# Patient Record
Sex: Male | Born: 1945 | Race: White | Hispanic: No | Marital: Married | Smoking: Never smoker
Health system: Southern US, Community
[De-identification: ages and names within clinical notes are randomized; demographics above are authoritative.]

## PROBLEM LIST (undated history)

## (undated) DIAGNOSIS — E785 Hyperlipidemia, unspecified: Secondary | ICD-10-CM

## (undated) DIAGNOSIS — G709 Myoneural disorder, unspecified: Secondary | ICD-10-CM

## (undated) DIAGNOSIS — F39 Unspecified mood [affective] disorder: Secondary | ICD-10-CM

## (undated) DIAGNOSIS — I639 Cerebral infarction, unspecified: Secondary | ICD-10-CM

## (undated) DIAGNOSIS — F329 Major depressive disorder, single episode, unspecified: Secondary | ICD-10-CM

## (undated) DIAGNOSIS — F32A Depression, unspecified: Secondary | ICD-10-CM

## (undated) DIAGNOSIS — M1990 Unspecified osteoarthritis, unspecified site: Secondary | ICD-10-CM

## (undated) DIAGNOSIS — I10 Essential (primary) hypertension: Secondary | ICD-10-CM

## (undated) DIAGNOSIS — C801 Malignant (primary) neoplasm, unspecified: Secondary | ICD-10-CM

## (undated) DIAGNOSIS — G459 Transient cerebral ischemic attack, unspecified: Secondary | ICD-10-CM

## (undated) DIAGNOSIS — F419 Anxiety disorder, unspecified: Secondary | ICD-10-CM

## (undated) DIAGNOSIS — H9190 Unspecified hearing loss, unspecified ear: Secondary | ICD-10-CM

## (undated) DIAGNOSIS — M47816 Spondylosis without myelopathy or radiculopathy, lumbar region: Secondary | ICD-10-CM

## (undated) HISTORY — DX: Depression, unspecified: F32.A

## (undated) HISTORY — DX: Major depressive disorder, single episode, unspecified: F32.9

## (undated) HISTORY — DX: Unspecified osteoarthritis, unspecified site: M19.90

## (undated) HISTORY — DX: Myoneural disorder, unspecified: G70.9

## (undated) HISTORY — DX: Unspecified mood (affective) disorder: F39

## (undated) HISTORY — DX: Hyperlipidemia, unspecified: E78.5

## (undated) HISTORY — PX: TONSILLECTOMY: SUR1361

## (undated) HISTORY — DX: Essential (primary) hypertension: I10

## (undated) HISTORY — DX: Malignant (primary) neoplasm, unspecified: C80.1

## (undated) HISTORY — DX: Spondylosis without myelopathy or radiculopathy, lumbar region: M47.816

## (undated) HISTORY — PX: SHOULDER SURGERY: SHX246

## (undated) HISTORY — DX: Cerebral infarction, unspecified: I63.9

## (undated) HISTORY — PX: EYE SURGERY: SHX253

## (undated) HISTORY — DX: Unspecified hearing loss, unspecified ear: H91.90

## (undated) HISTORY — DX: Anxiety disorder, unspecified: F41.9

---

## 2005-04-28 ENCOUNTER — Encounter: Payer: Self-pay | Admitting: Internal Medicine

## 2006-02-12 ENCOUNTER — Encounter: Admission: RE | Admit: 2006-02-12 | Discharge: 2006-02-22 | Payer: Self-pay | Admitting: Neurology

## 2006-02-12 ENCOUNTER — Ambulatory Visit: Payer: Self-pay | Admitting: Psychology

## 2007-03-01 ENCOUNTER — Ambulatory Visit: Payer: Self-pay | Admitting: *Deleted

## 2007-07-10 ENCOUNTER — Ambulatory Visit: Payer: Self-pay | Admitting: *Deleted

## 2007-07-24 ENCOUNTER — Ambulatory Visit: Payer: Self-pay | Admitting: *Deleted

## 2007-08-07 ENCOUNTER — Ambulatory Visit: Payer: Self-pay | Admitting: *Deleted

## 2008-10-06 ENCOUNTER — Ambulatory Visit: Payer: Self-pay | Admitting: Internal Medicine

## 2008-10-29 ENCOUNTER — Encounter: Payer: Self-pay | Admitting: Internal Medicine

## 2008-10-29 ENCOUNTER — Ambulatory Visit: Payer: Self-pay | Admitting: Internal Medicine

## 2008-10-31 ENCOUNTER — Encounter: Payer: Self-pay | Admitting: Internal Medicine

## 2010-11-07 ENCOUNTER — Encounter (HOSPITAL_COMMUNITY): Payer: Self-pay | Admitting: Pharmacy Technician

## 2010-11-08 ENCOUNTER — Inpatient Hospital Stay (HOSPITAL_COMMUNITY): Admission: RE | Admit: 2010-11-08 | Payer: Self-pay | Source: Ambulatory Visit

## 2010-11-14 ENCOUNTER — Inpatient Hospital Stay (HOSPITAL_COMMUNITY): Admission: RE | Admit: 2010-11-14 | Payer: Medicare Other | Source: Ambulatory Visit | Admitting: Orthopedic Surgery

## 2010-11-14 ENCOUNTER — Encounter (HOSPITAL_COMMUNITY): Admission: RE | Payer: Self-pay | Source: Ambulatory Visit

## 2010-11-14 SURGERY — ARTHROPLASTY, HIP, TOTAL,POSTERIOR APPROACH
Anesthesia: General | Laterality: Right

## 2010-12-23 ENCOUNTER — Ambulatory Visit (INDEPENDENT_AMBULATORY_CARE_PROVIDER_SITE_OTHER): Payer: Medicare Other

## 2010-12-23 ENCOUNTER — Encounter: Payer: Self-pay | Admitting: Internal Medicine

## 2010-12-23 DIAGNOSIS — H9190 Unspecified hearing loss, unspecified ear: Secondary | ICD-10-CM | POA: Insufficient documentation

## 2010-12-23 DIAGNOSIS — Z0000 Encounter for general adult medical examination without abnormal findings: Secondary | ICD-10-CM

## 2010-12-23 DIAGNOSIS — F341 Dysthymic disorder: Secondary | ICD-10-CM

## 2010-12-23 DIAGNOSIS — E7800 Pure hypercholesterolemia, unspecified: Secondary | ICD-10-CM

## 2010-12-23 DIAGNOSIS — F39 Unspecified mood [affective] disorder: Secondary | ICD-10-CM | POA: Insufficient documentation

## 2010-12-23 DIAGNOSIS — E785 Hyperlipidemia, unspecified: Secondary | ICD-10-CM | POA: Insufficient documentation

## 2010-12-23 DIAGNOSIS — I10 Essential (primary) hypertension: Secondary | ICD-10-CM

## 2010-12-23 DIAGNOSIS — Z23 Encounter for immunization: Secondary | ICD-10-CM

## 2011-01-25 ENCOUNTER — Encounter: Payer: Self-pay | Admitting: Internal Medicine

## 2011-03-06 ENCOUNTER — Telehealth: Payer: Self-pay

## 2011-03-06 NOTE — Telephone Encounter (Signed)
PT called his buprion was changed from 3 per day to 2 per day.  He would like to return to 3 per day.  Pt of dr Merla Riches.

## 2011-03-06 NOTE — Telephone Encounter (Signed)
[facility] calling for dr Merla Riches with questions about pts Bupropion rx.   Best: [phone]  bf

## 2011-03-08 NOTE — Telephone Encounter (Signed)
Need chart I guess-don't remember seeing him or changing meds recently

## 2011-03-09 MED ORDER — BUPROPION HCL ER (SR) 150 MG PO TB12: 150.0000 mg | ORAL_TABLET | Freq: Three times a day (TID) | ORAL | Status: DC

## 2011-03-09 NOTE — Telephone Encounter (Signed)
Routing to [employer] w/chart since Dr Merla Riches will not be in today.

## 2011-03-09 NOTE — Telephone Encounter (Signed)
Ok'd Wellbutrin Sr 150mg  tid with pharmacy.

## 2011-03-20 ENCOUNTER — Telehealth: Payer: Self-pay

## 2011-03-20 NOTE — Telephone Encounter (Signed)
Patient dropped off form for Dr. Merla Riches to sign for scuba class. I assume patient wants form mailed to him after filling it out because he left an envelope with it. *Chart at [employer].*

## 2011-03-21 NOTE — Telephone Encounter (Signed)
Spoke with pt to let him know that Dr Merla Riches will be out of office until next week. Pt stated that it can wait if necessary, but if Dr Cleta Alberts could do it this week it would be best. Dr Cleta Alberts, pts chart is in your box.

## 2011-03-22 NOTE — Telephone Encounter (Signed)
Called pt back and let him know that Dr Merla Riches will need to fill out form next week and that we will let him know when ready. Pt said that was fine.

## 2011-04-01 ENCOUNTER — Ambulatory Visit (INDEPENDENT_AMBULATORY_CARE_PROVIDER_SITE_OTHER): Payer: Medicare Other

## 2011-05-22 ENCOUNTER — Ambulatory Visit (INDEPENDENT_AMBULATORY_CARE_PROVIDER_SITE_OTHER): Payer: Medicare Other | Admitting: Internal Medicine

## 2011-05-22 ENCOUNTER — Ambulatory Visit: Payer: Medicare Other

## 2011-05-22 VITALS — BP 124/71 | HR 80 | Temp 98.4°F | Resp 16 | Ht 68.5 in | Wt 186.0 lb

## 2011-05-22 DIAGNOSIS — S60229A Contusion of unspecified hand, initial encounter: Secondary | ICD-10-CM

## 2011-05-22 DIAGNOSIS — S92403A Displaced unspecified fracture of unspecified great toe, initial encounter for closed fracture: Secondary | ICD-10-CM

## 2011-05-22 DIAGNOSIS — M79675 Pain in left toe(s): Secondary | ICD-10-CM

## 2011-05-22 DIAGNOSIS — M25549 Pain in joints of unspecified hand: Secondary | ICD-10-CM

## 2011-05-22 DIAGNOSIS — M79643 Pain in unspecified hand: Secondary | ICD-10-CM

## 2011-05-22 DIAGNOSIS — M79609 Pain in unspecified limb: Secondary | ICD-10-CM

## 2011-05-22 MED ORDER — TRAMADOL HCL 50 MG PO TABS: 50.0000 mg | ORAL_TABLET | Freq: Three times a day (TID) | ORAL | Status: AC | PRN

## 2011-05-22 MED ORDER — CEPHALEXIN 500 MG PO CAPS: 500.0000 mg | ORAL_CAPSULE | Freq: Three times a day (TID) | ORAL | Status: AC

## 2011-05-22 NOTE — Patient Instructions (Signed)
Wear the postop shoe as directed. Ice elevation to injured areas. Daily dressing change. Ultram as directed. Return to office for follow up in one week

## 2011-05-22 NOTE — Progress Notes (Signed)
   Patient ID: Windsor Goeken MRN: [id number], DOB: Jan 31, 1945, 66 y.o. Date of Encounter: 05/22/2011, 9:26 AM   PROCEDURE NOTE: Verbal consent obtained. Sterile technique employed. Numbing: Anesthesia obtained with 1:1 ratio 2% plain lidocaine with 0.5% Marcaine  Betadine prep per usual protocol.  Burr hole times three to relieve pressure underneath the nail.  Dark blood expressed through burr holes.   Wound care instructions including precautions covered with patient. Handout given.  Patient tolerated procedure well.  Signed,  Eula Listen, PA-C 05/22/2011 9:26 AM

## 2011-05-22 NOTE — Progress Notes (Signed)
  Subjective:    Patient ID: Reginald Edwards, male    DOB: [birth year], 66 y.o.   MRN: [id number]  HPI Pain to the right hand and the left great toe after he fell 2 days ago. Pain and swelling to the great toe.pain 10/10 increased with walking. Hand pain 6/10 increased with motion, moderate in severity. No loc no head or other injuries.No radiation of pain. Increases with motion.   Review of Systems  Constitutional: Negative.   HENT: Negative.   Eyes: Negative.   Respiratory: Negative.   Cardiovascular: Negative.   Gastrointestinal: Negative.   Genitourinary: Negative.   Musculoskeletal: Positive for joint swelling.       Great toe swelling of the left toe.pain in palpation. Swelling and pain of right hand digits 3 4 and 5  Skin: Negative.   Neurological: Negative.   Hematological: Negative.   Psychiatric/Behavioral: Negative.        Objective:   Physical Exam  Nursing note and vitals reviewed. Constitutional: He is oriented to person, place, and time. He appears well-developed and well-nourished.  HENT:  Head: Normocephalic and atraumatic.  Nose: Nose normal.  Mouth/Throat: Oropharynx is clear and moist.  Eyes: Conjunctivae and EOM are normal. Pupils are equal, round, and reactive to light.  Neck: Normal range of motion. Neck supple.  Cardiovascular: Normal rate and regular rhythm.   Pulmonary/Chest: Effort normal.  Abdominal: Soft.  Musculoskeletal:       Great toe has a subungual hematoma and ecchymosis to the entire great left toe. Also right hand with ecchymosis to digits 345 and the palm of the right hand.  Neurological: He is alert and oriented to person, place, and time.  Skin: Skin is warm and dry.  Psychiatric: He has a normal mood and affect. His behavior is normal. Judgment and thought content normal.     [facility] reading (PRIMARY) by  Dr. Mindi Junker  small avulsion fx at the base of the great left toe ..hand xray r hand is negative for fracture or dislocation.      Assessment & Plan:  subungual hematoma of the great left toe with avulsion fracture. Will release the hematoma and place patient in a postop shoe with analgesics. Hand injury to your rubber ball for pt and analgesics.

## 2011-08-05 ENCOUNTER — Ambulatory Visit (INDEPENDENT_AMBULATORY_CARE_PROVIDER_SITE_OTHER): Payer: Medicare Other | Admitting: Internal Medicine

## 2011-08-05 VITALS — BP 135/77 | HR 89 | Temp 99.0°F | Resp 16 | Ht 68.5 in | Wt 187.0 lb

## 2011-08-05 DIAGNOSIS — G252 Other specified forms of tremor: Secondary | ICD-10-CM

## 2011-08-05 DIAGNOSIS — G250 Essential tremor: Secondary | ICD-10-CM

## 2011-08-05 MED ORDER — PROPRANOLOL HCL ER 80 MG PO CP24: 80.0000 mg | ORAL_CAPSULE | Freq: Every day | ORAL | Status: DC

## 2011-08-05 NOTE — Progress Notes (Signed)
  Subjective:    Patient ID: Reginald Edwards, male    DOB: [birth year], 66 y.o.   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 propranolol ER (INDERAL LA) 80 MG 24 hr capsule    Sig: Take 1 capsule (80 mg total) by mouth daily.    Dispense:  30 capsule    Refill:  0

## 2011-08-31 ENCOUNTER — Ambulatory Visit (INDEPENDENT_AMBULATORY_CARE_PROVIDER_SITE_OTHER): Payer: Medicare Other | Admitting: Emergency Medicine

## 2011-08-31 VITALS — BP 160/80 | HR 102 | Temp 98.4°F | Resp 18 | Ht 68.0 in | Wt 186.0 lb

## 2011-08-31 DIAGNOSIS — R9431 Abnormal electrocardiogram [ECG] [EKG]: Secondary | ICD-10-CM

## 2011-08-31 DIAGNOSIS — F419 Anxiety disorder, unspecified: Secondary | ICD-10-CM

## 2011-08-31 DIAGNOSIS — F411 Generalized anxiety disorder: Secondary | ICD-10-CM

## 2011-08-31 LAB — COMPREHENSIVE METABOLIC PANEL
ALT: 41 U/L (ref 0–53)
BUN: 17 mg/dL (ref 6–23)
CO2: 31 mEq/L (ref 19–32)
Calcium: 10.1 mg/dL (ref 8.4–10.5)
Chloride: 103 mEq/L (ref 96–112)
Creat: 1.19 mg/dL (ref 0.50–1.35)
Glucose, Bld: 137 mg/dL — ABNORMAL HIGH (ref 70–99)
Total Bilirubin: 0.4 mg/dL (ref 0.3–1.2)

## 2011-08-31 LAB — POCT CBC
HCT, POC: 48.5 % (ref 43.5–53.7)
Hemoglobin: 15.7 g/dL (ref 14.1–18.1)
Lymph, poc: 2.2 (ref 0.6–3.4)
MCH, POC: 29.8 pg (ref 27–31.2)
MCHC: 32.4 g/dL (ref 31.8–35.4)
MCV: 92.1 fL (ref 80–97)
POC Granulocyte: 7.9 — AB (ref 2–6.9)
POC LYMPH PERCENT: 20 %L (ref 10–50)
RDW, POC: 13.8 %
WBC: 11 10*3/uL — AB (ref 4.6–10.2)

## 2011-08-31 MED ORDER — ALPRAZOLAM 0.25 MG PO TABS
0.2500 mg | ORAL_TABLET | Freq: Once | ORAL | Status: AC
  Administered 2011-08-31: 0.25 mg via ORAL

## 2011-08-31 MED ORDER — ALPRAZOLAM 0.25 MG PO TABS: 0.2500 mg | ORAL_TABLET | Freq: Two times a day (BID) | ORAL | Status: AC | PRN

## 2011-08-31 MED ORDER — ALPRAZOLAM 0.25 MG PO TABS: 0.5000 mg | ORAL_TABLET | Freq: Once | ORAL | Status: DC

## 2011-08-31 NOTE — Progress Notes (Signed)
  Subjective:    Patient ID: Reginald Edwards, male    DOB: [birth year], 66 y.o.   MRN: [id number]  HPI presents today with anxiety x 3 weeks. Its progressively gotten worse.Today is the worst it has been. He feels like he could vomit because its so bad. He has been chewing on his lip, nausea, vomiting, and tremors. Can't get focused and started on task. Sometimes he gets a tightness feel and feels like he cant get things done. He is retired and states no problems home, family or kids. Not sure what has caused this. Feels like he is psychologically frozen. He has one caffeine drink a day. He is on Adderall 20mg .     Review of Systems     Objective:   Physical Exam HEENT exam is unremarkable. His thyroid is not enlarged. His chest is clear his cardiac exam reveals tachycardia. Abdomen is soft and nontender. Neurological exam reveals the patient to the alert and cooperative. His hands are tremulous. Deep tendon reflexes are symmetrical.  Results for orders placed in visit on 08/31/11  POCT CBC      Component Value Range   WBC 11.0 (*) 4.6 - 10.2 K/uL   Lymph, poc 2.2  0.6 - 3.4   POC LYMPH PERCENT 20.0  10 - 50 %L   MID (cbc) 0.9  0 - 0.9   POC MID % 8.6  0 - 12 %M   POC Granulocyte 7.9 (*) 2 - 6.9   Granulocyte percent 71.4  37 - 80 %G   RBC 5.27  4.69 - 6.13 M/uL   Hemoglobin 15.7  14.1 - 18.1 g/dL   HCT, POC 40.9  81.1 - 53.7 %   MCV 92.1  80 - 97 fL   MCH, POC 29.8  27 - 31.2 pg   MCHC 32.4  31.8 - 35.4 g/dL   RDW, POC 91.4     Platelet Count, POC 295  142 - 424 K/uL   MPV 8.2  0 - 99.8 fL        Assessment & Plan:  Go ahead and give Xanax 0.25 year routine labs including thyroid testing will be performed. We'll do a baseline EKG because of his tachycardia. He was instructed to stop his Adderall as this may be contributing to his anxiousness. I think a revisit with Dr. Evelene Croon would be a good idea to see if she may need to readjust his medications.. Will cover with Xanax 0.25 3 times  a day until that appointment with Dr. Evelene Croon.

## 2011-09-01 LAB — T4, FREE: Free T4: 1.26 ng/dL (ref 0.80–1.80)

## 2011-09-02 ENCOUNTER — Telehealth: Payer: Self-pay | Admitting: Family Medicine

## 2011-09-02 NOTE — Telephone Encounter (Signed)
Patient much better he has an appt in Oct to see Dr Ina Kick him lab results also

## 2011-09-21 ENCOUNTER — Ambulatory Visit: Payer: Medicare Other | Admitting: Cardiology

## 2011-09-28 ENCOUNTER — Encounter: Payer: Self-pay | Admitting: Emergency Medicine

## 2011-09-29 ENCOUNTER — Encounter: Payer: Self-pay | Admitting: *Deleted

## 2011-10-02 ENCOUNTER — Encounter: Payer: Self-pay | Admitting: Cardiology

## 2011-10-02 ENCOUNTER — Ambulatory Visit (INDEPENDENT_AMBULATORY_CARE_PROVIDER_SITE_OTHER): Payer: Medicare Other | Admitting: Cardiology

## 2011-10-02 VITALS — BP 124/88 | HR 61 | Ht 68.0 in | Wt 195.0 lb

## 2011-10-02 DIAGNOSIS — R0789 Other chest pain: Secondary | ICD-10-CM | POA: Insufficient documentation

## 2011-10-02 DIAGNOSIS — R0602 Shortness of breath: Secondary | ICD-10-CM

## 2011-10-02 DIAGNOSIS — G35 Multiple sclerosis: Secondary | ICD-10-CM | POA: Insufficient documentation

## 2011-10-02 DIAGNOSIS — R0609 Other forms of dyspnea: Secondary | ICD-10-CM | POA: Insufficient documentation

## 2011-10-02 DIAGNOSIS — R9431 Abnormal electrocardiogram [ECG] [EKG]: Secondary | ICD-10-CM

## 2011-10-02 DIAGNOSIS — R0989 Other specified symptoms and signs involving the circulatory and respiratory systems: Secondary | ICD-10-CM

## 2011-10-02 NOTE — Patient Instructions (Addendum)
Decrease aspirin to 81mg  daily.  Your physician recommends that you have lab work today--BNP.  Your physician has requested that you have an echocardiogram. Echocardiography is a painless test that uses sound waves to create images of your heart. It provides your doctor with information about the size and shape of your heart and how well your heart's chambers and valves are working. This procedure takes approximately one hour. There are no restrictions for this procedure.  Your physician has requested that you have a lexiscan myoview. For further information please visit [email or web address] Please follow instruction sheet, as given.  Your physician recommends that you schedule a follow-up appointment in: 2 weeks with Dr Shirlee Latch.

## 2011-10-02 NOTE — Progress Notes (Signed)
Patient ID: Reginald Edwards, male   DOB: 1945-09-22, 66 y.o.   MRN: [id number] PCP: Dr. Merla Edwards  66 yo with history of HTN and hyperlipidemia as well as recent diagnosis of multiple sclerosis presents for cardiology evaluation.  He was sent to cardiology because of an episode in 8/13 that he refers to as an "anxiety attack."  He had an episode that lasted for a couple of days in which he was extremely "jittery" and fearful.  He felt his heart beating hard and some chest discomfort.  He was seen by Dr. Cleta Edwards and noted on ECG to have about 1 mm ST depression in the inferior leads.  He was given Xanax and felt much better.  He has had no recurrence of this episode.  At baseline, no chest pain.  He has had dyspnea just walking around his house, playing with grandchildren, or doing yardwork for about a year.  This has gradually worsened.  He has also noted leg weakness for several months now.  He has fallen frequently recently.  He was on a trip to [place] this month and felt like his legs were especially weak.  He went to a hospital and ended up admitted for 4 days.  He was given a diagnosis of multiple sclerosis.  He did have an MRI.  Currently, he is using a wheelchair for long distances and walking short distances.  He is afraid of falling.  He also has had ankle edema since discharge from the hospital in [place].   ECG: NSR, normal (today).  1 mm ST depression on ECG from 8/13 episode.   PMH: 1. Benign essential tremor. 2. HTN 3. Hyperlipidemia 4. Anxiety 5. Multiple sclerosis: Diagnosed in 9/13 in [place], hospitalized 4 days.  Apparently had typical lesions on MRI.   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   FH: No premature CAD.  ROS: All systems reviewed and negative except as per HPI.   Current Outpatient Prescriptions  Medication Sig Dispense Refill  . amLODipine (NORVASC) 10 MG tablet Take 10 mg by mouth daily.        Marland Kitchen amphetamine-dextroamphetamine  (ADDERALL) 20 MG tablet Take 20 mg by mouth 3 (three) times daily.        Marland Kitchen atorvastatin (LIPITOR) 20 MG tablet Take 20 mg by mouth daily. Patient stated that he will start taking lipitor when he runs out of crestor       . buPROPion (WELLBUTRIN SR) 150 MG 12 hr tablet Take 1 tablet (150 mg total) by mouth 3 (three) times daily.  90 tablet  0  . FLUoxetine (PROZAC) 40 MG capsule Take 40 mg by mouth daily.        . propranolol ER (INDERAL LA) 80 MG 24 hr capsule Take 80 mg by mouth as needed.      . temazepam (RESTORIL) 30 MG capsule Take 30 mg by mouth at bedtime as needed. For sleep       . vitamin C (ASCORBIC ACID) 500 MG tablet Take 500 mg by mouth daily.        Marland Kitchen VITAMIN D, CHOLECALCIFEROL, PO Take 5,000 Units by mouth daily.        Marland Kitchen DISCONTD: aspirin 325 MG tablet Take 325 mg by mouth daily.        Marland Kitchen DISCONTD: propranolol ER (INDERAL LA) 80 MG 24 hr capsule Take 1 capsule (80 mg total) by mouth daily.  30 capsule  0  . aspirin 81 MG tablet  Take 1 tablet (81 mg total) by mouth daily.        BP 124/88  Pulse 61  Ht 5\' 8"  (1.727 m)  Wt 195 lb (88.451 kg)  BMI 29.65 kg/m2  SpO2 98% General: NAD Neck: No JVD, no thyromegaly or thyroid nodule.  Lungs: Clear to auscultation bilaterally with normal respiratory effort. CV: Nondisplaced PMI.  Heart regular S1/S2, no S3/S4, no murmur.  1+ edema 1/3 up lower legs bilaterally.  No carotid bruit.  Normal pedal pulses.  Abdomen: Soft, nontender, no hepatosplenomegaly, no distention.  Skin: Intact without lesions or rashes.  Neurologic: Alert and oriented x 3.  Psych: Normal affect. Extremities: No clubbing or cyanosis.  HEENT: Normal.   Assessment/Plan: 1. Chest discomfort/anxiety attack: Patient had an episode that may have been an anxiety attack in 8/13.  He did have some chest discomfort associated with a sensation of feeling his heart beat.  His ECG at the time did show some ST depression in the inferior leads.  He improved with Xanax.  He  has not had an episode since that time.  I think that it would be reasonable to get a Lexiscan myoview for risk stratification, especially given the ST changes.  He will continue ASA (can decrease to 81 mg daily).   2. Exertional dyspnea: This has been going on for about a year also.  I wonder if this could be due to increased effort to do physical work caused by weakness from MS.  He does have some peripheral edema but no JVD.  I will have him get an echocardiogram and I will check a BNP.  3. Multiple sclerosis: New diagnosis.  He does not have a neurologist in [place].  He will contact Dr. Merla Edwards about a referral.   Reginald Edwards 10/02/2011

## 2011-10-04 ENCOUNTER — Ambulatory Visit (HOSPITAL_COMMUNITY): Payer: Medicare Other | Attending: Cardiovascular Disease | Admitting: Radiology

## 2011-10-04 DIAGNOSIS — R0602 Shortness of breath: Secondary | ICD-10-CM

## 2011-10-04 DIAGNOSIS — R0609 Other forms of dyspnea: Secondary | ICD-10-CM

## 2011-10-04 DIAGNOSIS — G35 Multiple sclerosis: Secondary | ICD-10-CM | POA: Insufficient documentation

## 2011-10-04 DIAGNOSIS — I10 Essential (primary) hypertension: Secondary | ICD-10-CM | POA: Insufficient documentation

## 2011-10-04 DIAGNOSIS — R9431 Abnormal electrocardiogram [ECG] [EKG]: Secondary | ICD-10-CM

## 2011-10-04 NOTE — Progress Notes (Signed)
Echocardiogram performed.  

## 2011-10-05 ENCOUNTER — Ambulatory Visit (HOSPITAL_COMMUNITY): Payer: Medicare Other | Attending: Cardiology | Admitting: Radiology

## 2011-10-05 VITALS — BP 112/71 | Ht 68.0 in | Wt 192.0 lb

## 2011-10-05 DIAGNOSIS — R0609 Other forms of dyspnea: Secondary | ICD-10-CM | POA: Insufficient documentation

## 2011-10-05 DIAGNOSIS — R0602 Shortness of breath: Secondary | ICD-10-CM

## 2011-10-05 DIAGNOSIS — R002 Palpitations: Secondary | ICD-10-CM | POA: Insufficient documentation

## 2011-10-05 DIAGNOSIS — R0789 Other chest pain: Secondary | ICD-10-CM

## 2011-10-05 DIAGNOSIS — R079 Chest pain, unspecified: Secondary | ICD-10-CM

## 2011-10-05 DIAGNOSIS — R000 Tachycardia, unspecified: Secondary | ICD-10-CM | POA: Insufficient documentation

## 2011-10-05 DIAGNOSIS — R9431 Abnormal electrocardiogram [ECG] [EKG]: Secondary | ICD-10-CM

## 2011-10-05 DIAGNOSIS — R0989 Other specified symptoms and signs involving the circulatory and respiratory systems: Secondary | ICD-10-CM | POA: Insufficient documentation

## 2011-10-05 DIAGNOSIS — I10 Essential (primary) hypertension: Secondary | ICD-10-CM

## 2011-10-05 DIAGNOSIS — G35 Multiple sclerosis: Secondary | ICD-10-CM | POA: Insufficient documentation

## 2011-10-05 MED ORDER — TECHNETIUM TC 99M SESTAMIBI GENERIC - CARDIOLITE
10.0000 | Freq: Once | INTRAVENOUS | Status: AC | PRN
  Administered 2011-10-05: 10 via INTRAVENOUS

## 2011-10-05 MED ORDER — TECHNETIUM TC 99M SESTAMIBI GENERIC - CARDIOLITE
30.0000 | Freq: Once | INTRAVENOUS | Status: AC | PRN
  Administered 2011-10-05: 30 via INTRAVENOUS

## 2011-10-05 MED ORDER — REGADENOSON 0.4 MG/5ML IV SOLN
0.4000 mg | Freq: Once | INTRAVENOUS | Status: AC
  Administered 2011-10-05: 0.4 mg via INTRAVENOUS

## 2011-10-05 NOTE — Progress Notes (Addendum)
[facility] SITE 3 NUCLEAR MED [id number] [place] [address] [phone]  Cardiology Nuclear Med Study  Reginald Edwards is a 66 y.o. male     MRN : [id number]     DOB: 01-16-1945  Procedure Date: 10/05/2011  Nuclear Med Background Indication for Stress Test:  Evaluation for Ischemia and Abnormal EKG History:   Recent diagnosis of MS Cardiac Risk Factors: Hypertension and Lipids  Symptoms:  DOE, Fatigue, Palpitations, Rapid HR and SOB   Nuclear Pre-Procedure Caffeine/Decaff Intake:  None NPO After: 5:00am   Lungs:  clear O2 Sat: 98% on room air. IV 0.9% NS with Angio Cath:  20g  IV Site: R Antecubital  IV Started by:  Stanton Kidney, EMT-P  Chest Size (in):  44 Cup Size: n/a  Height: 5\' 8"  (1.727 m)  Weight:  192 lb (87.091 kg)  BMI:  Body mass index is 29.19 kg/(m^2). Tech Comments:  NA    Nuclear Med Study 1 or 2 day study: 1 day  Stress Test Type:  Lexiscan  Reading MD: [name], MD  Order Authorizing Provider:  [name], MD  Resting Radionuclide: Technetium 5m Sestamibi  Resting Radionuclide Dose: 10.9 mCi   Stress Radionuclide:  Technetium 67m Sestamibi  Stress Radionuclide Dose: 32.9 mCi           Stress Protocol Rest HR: 48 Stress HR: 58  Rest BP: 112/71 Stress BP: 124/72  Exercise Time (min): n/a METS: n/a   Predicted Max HR: 156 bpm % Max HR: 37.18 bpm Rate Pressure Product: 7192   Dose of Adenosine (mg):  n/a Dose of Lexiscan: 0.4 mg  Dose of Atropine (mg): n/a Dose of Dobutamine: n/a mcg/kg/min (at max HR)  Stress Test Technologist: Bonnita Levan, RN  Nuclear Technologist:  Domenic Polite, CNMT     Rest Procedure:  Myocardial perfusion imaging was performed at rest 45 minutes following the intravenous administration of Technetium 81m Sestamibi Rest ECG: Sinus Bradycardia  Stress Procedure:  The patient received IV Lexiscan 0.4 mg over 15-seconds.  Technetium 20m Sestamibi was injected at 30-seconds.   There were no significant changes with Lexiscan.  Quantitative spect images were obtained after a 45 minute delay. Stress ECG: No significant change from baseline ECG  QPS Raw Data Images:  Normal; no motion artifact; normal heart/lung ratio. Stress Images:  Normal homogeneous uptake in all areas of the myocardium. Rest Images:  Normal homogeneous uptake in all areas of the myocardium. Subtraction (SDS):  There is no evidence of scar or ischemia. Transient Ischemic Dilatation (Normal <1.22):  0.94 Lung/Heart Ratio (Normal <0.45):  0.37  Quantitative Gated Spect Images QGS EDV:  101 ml QGS ESV:  34 ml  Impression Exercise Capacity:  Lexiscan with no exercise. BP Response:  Normal blood pressure response. Clinical Symptoms:  No significant symptoms noted. ECG Impression:  No significant ST segment change suggestive of ischemia. Comparison with Prior Nuclear Study: No images to compare  Overall Impression:  Normal stress nuclear study.  LV Ejection Fraction: 66%.  LV Wall Motion:  NL LV Function; NL Wall Motion  [name] 10/05/2011  Normal study, please tell patient.   [name] 10/06/2011

## 2011-10-06 NOTE — Patient Instructions (Signed)
Pt notified stress test normal.

## 2011-10-09 NOTE — Progress Notes (Signed)
NA at either phone number listed.

## 2011-10-09 NOTE — Progress Notes (Signed)
Pt.notified

## 2011-10-11 ENCOUNTER — Encounter: Payer: Self-pay | Admitting: Internal Medicine

## 2011-10-11 ENCOUNTER — Ambulatory Visit (INDEPENDENT_AMBULATORY_CARE_PROVIDER_SITE_OTHER): Payer: Medicare Other | Admitting: Internal Medicine

## 2011-10-11 VITALS — BP 130/82 | HR 84 | Temp 98.7°F | Resp 16 | Ht 68.5 in | Wt 194.3 lb

## 2011-10-11 DIAGNOSIS — G252 Other specified forms of tremor: Secondary | ICD-10-CM

## 2011-10-11 DIAGNOSIS — G35 Multiple sclerosis: Secondary | ICD-10-CM

## 2011-10-11 DIAGNOSIS — F411 Generalized anxiety disorder: Secondary | ICD-10-CM

## 2011-10-11 DIAGNOSIS — F419 Anxiety disorder, unspecified: Secondary | ICD-10-CM

## 2011-10-11 DIAGNOSIS — G250 Essential tremor: Secondary | ICD-10-CM

## 2011-10-11 MED ORDER — ALPRAZOLAM 0.25 MG PO TABS: 0.2500 mg | ORAL_TABLET | Freq: Three times a day (TID) | ORAL | Status: DC | PRN

## 2011-10-11 MED ORDER — PROPRANOLOL HCL ER 80 MG PO CP24: 80.0000 mg | ORAL_CAPSULE | ORAL | Status: DC | PRN

## 2011-10-11 NOTE — Progress Notes (Addendum)
  Subjective:    Patient ID: Reginald Edwards, male    DOB: 05/16/1945, 66 y.o.   MRN: [id number]  [facility] just been diagnosed with multiple sclerosis after progressive motor dysfunction with multiple falls resulting in hospitalization in [place] in early September. He is basically wheelchair-bound with short walks. He has an appointment today with Dr. Sandria Manly to begin therapy. He continues to benefit from alprazolam for his anxiety as per prescription by Dr. Cleta Alberts in August The after discontinuing propanolol his tremors increased dramatically and he would like to restart his medication. He is need of some durable medical goods the cause of his new diagnosis. He was first referred to Dr. Sandria Manly in 2008 because of his tremors with vision difficulties and some memory problems. At the time he was also under treatment for mood disorder. His studies and exam were essentially nondiagnostic for MS at that point. Since that time he has improved from psychological standpoint, become very active with considerable weight loss, and brought his blood pressure under good control. He then experienced declining motor function starting sometime in August. His weakness in his legs has been evaluated by Dr. Luiz Blare within the past 6 weeks. He continues to complain of cognitive problems such as slow thinking, poor concentration, poor ability to grasp concepts, anxiety, slow processing times, and other emotional distress. His wife confirms this.  other diag include  Patient Active Problem List  Diagnosis  . Mood disorder-Stable on medication until recent anxiety which has responded alprazolam/On Prozac 40 mg and Wellbutrin 150 mg SR/also on Adderall 20 mg 2 or 3 times a day per Dr Evelene Croon psychiatry  . Hyperlipidemia-Continues on atorvastatin  . Essential hypertension, benign-Continues on amlodipine 10 mg  . Hearing loss  . Chest discomfort-Cardiac evaluation by Dr. Sherlie Ban normal 09/2011  . Exertional dyspnea-Probably related to  muscle weakness  - Benign essential tremor-    -  Insomnia-ooccasional Restoril 30 mg    Review of Systems Covered in the present illness    Objective:   Physical Exam Filed Vitals:   10/11/11 1050  BP: 130/82  Pulse: 84  Temp: 98.7 F (37.1 C)  Resp: 16  Weight 194 pounds Pulse ox 98 He is wheelchair bound in the room His tremor is active in both arms with the use His mood is good considering his recent diagnosis and his anxiety seems appropriate      Assessment & Plan:   Patient Active Problem List  Diagnosis  . Mood disorder--Relapse of anxiety  . Hyperlipidemia  . Essential hypertension, benign  . Hearing loss  . Chest discomfort  . Exertional dyspnea  . MS (multiple sclerosis)    -  Benign essential tremor Handicap placard Order for stair railings and tub bar Refill Inderal LA 80 mg one daily May call for other refills as needed Followup for routine care in 3 months and sooner if other problems arise

## 2011-10-20 ENCOUNTER — Ambulatory Visit (INDEPENDENT_AMBULATORY_CARE_PROVIDER_SITE_OTHER): Payer: Medicare Other | Admitting: Cardiology

## 2011-10-20 ENCOUNTER — Ambulatory Visit: Payer: Medicare Other | Attending: Neurology | Admitting: Rehabilitative and Restorative Service Providers"

## 2011-10-20 ENCOUNTER — Encounter: Payer: Self-pay | Admitting: Cardiology

## 2011-10-20 VITALS — BP 116/72 | HR 49 | Ht 68.5 in | Wt 199.0 lb

## 2011-10-20 DIAGNOSIS — R262 Difficulty in walking, not elsewhere classified: Secondary | ICD-10-CM | POA: Insufficient documentation

## 2011-10-20 DIAGNOSIS — R0789 Other chest pain: Secondary | ICD-10-CM

## 2011-10-20 DIAGNOSIS — IMO0001 Reserved for inherently not codable concepts without codable children: Secondary | ICD-10-CM | POA: Insufficient documentation

## 2011-10-20 DIAGNOSIS — G35 Multiple sclerosis: Secondary | ICD-10-CM

## 2011-10-20 DIAGNOSIS — R0609 Other forms of dyspnea: Secondary | ICD-10-CM

## 2011-10-20 DIAGNOSIS — M6281 Muscle weakness (generalized): Secondary | ICD-10-CM | POA: Insufficient documentation

## 2011-10-20 NOTE — Patient Instructions (Signed)
Your physician recommends that you schedule a follow-up appointment as needed with Dr McLean.  

## 2011-10-21 NOTE — Progress Notes (Signed)
Patient ID: Reginald Edwards, male   DOB: 1945/02/11, 66 y.o.   MRN: [id number] PCP: Dr. Merla Riches  66 yo with history of HTN and hyperlipidemia as well as recent diagnosis of multiple sclerosis presents for cardiology evaluation.  He was sent to cardiology because of an episode in 8/13 that he refers to as an "anxiety attack."  He had an episode that lasted for a couple of days in which he was extremely "jittery" and fearful.  He felt his heart beating hard and some chest discomfort.  He was seen by Dr. Cleta Alberts and noted on ECG to have about 1 mm ST depression in the inferior leads.  He was given Xanax and felt much better.  He has had no recurrence of this episode.  At baseline, no chest pain.  He has had dyspnea just walking around his house, playing with grandchildren, or doing yardwork for about a year.  This has gradually worsened.  He has also noted leg weakness for several months now.  He has fallen frequently recently.  He was on a trip to [place] recently and felt like his legs were especially weak.  He went to a hospital and ended up admitted for 4 days.  He was given a diagnosis of multiple sclerosis.  He did have an MRI.  Currently, he is using a wheelchair for long distances and walking short distances.  He is afraid of falling.  He also has had ankle edema since discharge from the hospital in [place].   After last appointment, I had Mr Dobratz get a [employer]  This was a normal study.  Echocardiogram also was normal.  He is planning to start treatment for MS with [employer].    Labs (9/13): BNP 16  PMH: 1. Benign essential tremor. 2. HTN 3. Hyperlipidemia 4. Anxiety 5. Multiple sclerosis: Diagnosed in 9/13 in [place], hospitalized 4 days.  Apparently had typical lesions on MRI.  6. Chest pain: Lexiscan myoview (10/13) with EF 66%, normal study.  7. Dyspnea: Echo (10/13) with EF 60-65%, no valvular abnormalities.   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   FH: No premature CAD.  Current Outpatient Prescriptions  Medication Sig Dispense Refill  . ALPRAZolam (XANAX) 0.25 MG tablet Take 1 tablet (0.25 mg total) by mouth 3 (three) times daily as needed for anxiety.  60 tablet  5  . amLODipine (NORVASC) 10 MG tablet Take 10 mg by mouth daily.        Marland Kitchen amphetamine-dextroamphetamine (ADDERALL) 20 MG tablet Take 20 mg by mouth 3 (three) times daily.        Marland Kitchen aspirin 81 MG tablet Take 81 mg by mouth daily. Per pt take 325 mg for now      . atorvastatin (LIPITOR) 20 MG tablet Take 20 mg by mouth daily. Patient stated that he will start taking lipitor when he runs out of crestor       . buPROPion (WELLBUTRIN SR) 150 MG 12 hr tablet Take 1 tablet (150 mg total) by mouth 3 (three) times daily.  90 tablet  0  . FLUoxetine (PROZAC) 40 MG capsule Take 40 mg by mouth daily.        . propranolol ER (INDERAL LA) 80 MG 24 hr capsule Take 1 capsule (80 mg total) by mouth as needed.  30 capsule  11  . temazepam (RESTORIL) 30 MG capsule Take 30 mg by mouth at bedtime as needed. For sleep       .  vitamin C (ASCORBIC ACID) 500 MG tablet Take 500 mg by mouth daily.        Marland Kitchen VITAMIN D, CHOLECALCIFEROL, PO Take 5,000 Units by mouth daily.          BP 116/72  Pulse 49  Ht 5' 8.5" (1.74 m)  Wt 199 lb (90.266 kg)  BMI 29.82 kg/m2 General: NAD Neck: No JVD, no thyromegaly or thyroid nodule.  Lungs: Clear to auscultation bilaterally with normal respiratory effort. CV: Nondisplaced PMI.  Heart regular S1/S2, no S3/S4, no murmur.  1+ edema 3/4 up lower legs bilaterally.  No carotid bruit.  Normal pedal pulses.  Abdomen: Soft, nontender, no hepatosplenomegaly, no distention.  Neurologic: Alert and oriented x 3.  Psych: Normal affect. Extremities: No clubbing or cyanosis.   Assessment/Plan: 1. Chest discomfort/anxiety attack: Patient had an episode that may have been an anxiety attack in 8/13.  He did have some chest discomfort associated with a sensation of  feeling his heart beat.  His ECG at the time did show some ST depression in the inferior leads.  He improved with Xanax.  He has not had an episode since that time.  Lexiscan myoview was normal.  He will continue ASA 81 mg daily. No further workup at this time.  2. Exertional dyspnea: This has been going on for about a year also.  I suspect this could be due to increased effort to do physical work caused by weakness from MS.  He does have some peripheral edema but no JVD.  Echo and BNP were both normal.  I think that the peripheral edema is likely due to blood pooling with lack of activity.  Keep legs elevated, watch Na intake.  3. Multiple sclerosis: He will be starting Tecfidera.  There do not appear to be significant heart effects from this medication.    [name] 10/21/2011

## 2011-10-25 ENCOUNTER — Ambulatory Visit: Payer: Medicare Other | Admitting: Physical Therapy

## 2011-11-03 ENCOUNTER — Ambulatory Visit: Payer: Medicare Other | Attending: Neurology | Admitting: Physical Therapy

## 2011-11-03 DIAGNOSIS — R262 Difficulty in walking, not elsewhere classified: Secondary | ICD-10-CM | POA: Insufficient documentation

## 2011-11-03 DIAGNOSIS — IMO0001 Reserved for inherently not codable concepts without codable children: Secondary | ICD-10-CM | POA: Insufficient documentation

## 2011-11-03 DIAGNOSIS — M6281 Muscle weakness (generalized): Secondary | ICD-10-CM | POA: Insufficient documentation

## 2011-11-06 ENCOUNTER — Ambulatory Visit: Payer: Medicare Other | Admitting: Physical Therapy

## 2011-11-09 ENCOUNTER — Ambulatory Visit: Payer: Medicare Other | Admitting: Occupational Therapy

## 2011-11-09 ENCOUNTER — Ambulatory Visit: Payer: Medicare Other | Admitting: Physical Therapy

## 2011-11-13 ENCOUNTER — Ambulatory Visit: Payer: Medicare Other | Admitting: Physical Therapy

## 2011-11-15 ENCOUNTER — Ambulatory Visit: Payer: Medicare Other | Admitting: Physical Therapy

## 2011-11-16 ENCOUNTER — Ambulatory Visit: Payer: Medicare Other | Admitting: Occupational Therapy

## 2011-11-16 ENCOUNTER — Ambulatory Visit: Payer: Medicare Other | Admitting: Physical Therapy

## 2011-11-20 ENCOUNTER — Ambulatory Visit: Payer: Medicare Other | Admitting: Occupational Therapy

## 2011-11-20 ENCOUNTER — Ambulatory Visit: Payer: Medicare Other | Admitting: Physical Therapy

## 2011-11-21 ENCOUNTER — Ambulatory Visit: Payer: Medicare Other | Admitting: Physical Therapy

## 2011-11-23 ENCOUNTER — Ambulatory Visit: Payer: Medicare Other | Admitting: Physical Therapy

## 2011-11-27 ENCOUNTER — Ambulatory Visit: Payer: Medicare Other | Admitting: Physical Therapy

## 2011-11-27 ENCOUNTER — Ambulatory Visit: Payer: Medicare Other | Admitting: Occupational Therapy

## 2011-12-04 ENCOUNTER — Encounter: Payer: Medicare Other | Admitting: Occupational Therapy

## 2011-12-05 ENCOUNTER — Ambulatory Visit: Payer: Medicare Other | Admitting: Physical Therapy

## 2011-12-05 ENCOUNTER — Ambulatory Visit: Payer: Medicare Other | Attending: Neurology | Admitting: Occupational Therapy

## 2011-12-05 DIAGNOSIS — R262 Difficulty in walking, not elsewhere classified: Secondary | ICD-10-CM | POA: Insufficient documentation

## 2011-12-05 DIAGNOSIS — M6281 Muscle weakness (generalized): Secondary | ICD-10-CM | POA: Insufficient documentation

## 2011-12-05 DIAGNOSIS — IMO0001 Reserved for inherently not codable concepts without codable children: Secondary | ICD-10-CM | POA: Insufficient documentation

## 2011-12-06 ENCOUNTER — Ambulatory Visit (INDEPENDENT_AMBULATORY_CARE_PROVIDER_SITE_OTHER): Payer: Medicare Other | Admitting: Internal Medicine

## 2011-12-06 ENCOUNTER — Encounter: Payer: Self-pay | Admitting: Internal Medicine

## 2011-12-06 VITALS — BP 126/82 | HR 86 | Temp 98.0°F | Resp 16 | Ht 68.0 in | Wt 199.2 lb

## 2011-12-06 DIAGNOSIS — H9190 Unspecified hearing loss, unspecified ear: Secondary | ICD-10-CM

## 2011-12-06 DIAGNOSIS — E785 Hyperlipidemia, unspecified: Secondary | ICD-10-CM

## 2011-12-06 DIAGNOSIS — R609 Edema, unspecified: Secondary | ICD-10-CM

## 2011-12-06 DIAGNOSIS — F39 Unspecified mood [affective] disorder: Secondary | ICD-10-CM

## 2011-12-06 DIAGNOSIS — Z0000 Encounter for general adult medical examination without abnormal findings: Secondary | ICD-10-CM

## 2011-12-06 DIAGNOSIS — I10 Essential (primary) hypertension: Secondary | ICD-10-CM

## 2011-12-06 DIAGNOSIS — R251 Tremor, unspecified: Secondary | ICD-10-CM

## 2011-12-06 DIAGNOSIS — G35 Multiple sclerosis: Secondary | ICD-10-CM

## 2011-12-06 LAB — POCT GLYCOSYLATED HEMOGLOBIN (HGB A1C): Hemoglobin A1C: 5.3

## 2011-12-06 LAB — CBC WITH DIFFERENTIAL/PLATELET
Basophils Absolute: 0.1 10*3/uL (ref 0.0–0.1)
Basophils Relative: 1 % (ref 0–1)
Eosinophils Absolute: 0.5 10*3/uL (ref 0.0–0.7)
Hemoglobin: 15.3 g/dL (ref 13.0–17.0)
MCH: 30.4 pg (ref 26.0–34.0)
MCHC: 33.8 g/dL (ref 30.0–36.0)
Monocytes Relative: 9 % (ref 3–12)
Neutro Abs: 5.8 10*3/uL (ref 1.7–7.7)
Neutrophils Relative %: 65 % (ref 43–77)
Platelets: 277 10*3/uL (ref 150–400)
RDW: 13.3 % (ref 11.5–15.5)

## 2011-12-06 LAB — TSH: TSH: 3.424 u[IU]/mL (ref 0.350–4.500)

## 2011-12-06 MED ORDER — LOSARTAN POTASSIUM-HCTZ 50-12.5 MG PO TABS: 1.0000 | ORAL_TABLET | Freq: Every day | ORAL | Status: DC

## 2011-12-06 MED ORDER — BUPROPION HCL ER (SR) 150 MG PO TB12: 150.0000 mg | ORAL_TABLET | Freq: Three times a day (TID) | ORAL | Status: DC

## 2011-12-06 NOTE — Progress Notes (Signed)
Subjective:    Patient ID: Reginald Edwards, male    DOB: 1945-11-02, 66 y.o.   MRN: [id number]  HPIannual exam Patient Active Problem List  Diagnosis  . Mood disorder  . Hyperlipidemia  . Essential hypertension, benign  . Hearing loss  . Chest discomfort  . Exertional dyspnea  . MS (multiple sclerosis)  . BETremor  recent diagnosis of MS has dominated other problems/he seems to be responding to medication started by Dr. Sandria Manly Tremor better on propran Over last 4 mos has noted swelling in legs and ankles not associated with shortness of breath or fatigue  Health maintenance parameters are up to date His wife is monitoring his medication  As his memory is not completely trustworthy from her standpoint  Currently being evaluated by urology (MacDiarmid)for urinary incontinence Had prostate exam today  Review of Systems With regard to dimethyl fumarate (tecfidera) he has had no flushing, abdominal pain, diarrhea, nausea, or random  Infections, and  there's been no skin rash, no vomiting or dyspepsia, and no change in urination. Other ROS= Fatigue and activity have changed since his recent diagnosis of multiple sclerosis please had no chills fever weight loss or appetite change No visual changes Long history of hearing loss treated appropriately with hearing aid is Chest tightness choking cough shortness of breath or sleep apnea No chest pain or palpitations No abdominal pain, blood in stool, constipation, nausea, or vomiting. His urinary to see in frequency with difficulty urinating including incontinence currently being evaluated He has random pains in his joints and back muscles without clear diagnosis at this point Skin is without changes He denies dizziness or lightheadedness, headaches, seizures, fainting, speech difficulty. He continues with weakness, difficulty walking, and tremors of hands. No easy bruising or swollen glands He denies anxiety and agitation. He feels like he  has decreased concentration with confusion at times and is often moody to the point of sadness. He denies self injury or suicidal ideas He has noticed no change in his head or or his temperature control    Objective:   Physical Exam  Constitutional: He is oriented to person, place, and time. He appears well-developed and well-nourished. No distress.  HENT:  Head: Normocephalic.  Right Ear: External ear normal.  Left Ear: External ear normal.  Nose: Nose normal.  Mouth/Throat: Oropharynx is clear and moist.       Hearing aid devices are in both ears both canals are clear and tympanic membranes intact  Eyes: Conjunctivae normal and EOM are normal. Pupils are equal, round, and reactive to light.  Neck: Normal range of motion. Neck supple. No thyromegaly present.  Cardiovascular: Normal rate, regular rhythm, normal heart sounds and intact distal pulses.  Exam reveals no gallop.   No murmur heard.      No carotid bruits  Pulmonary/Chest: Effort normal and breath sounds normal. He exhibits no tenderness.  Abdominal: Soft. Bowel sounds are normal. He exhibits no mass. There is no tenderness.       No abdominal bruit/no organomegaly  Musculoskeletal: Normal range of motion. He exhibits edema.       1+ pitting edema from knees to ankles bilaterally  Lymphadenopathy:    He has no cervical adenopathy.  Neurological: He is alert and oriented to person, place, and time. He has normal reflexes. No cranial nerve deficit. Coordination normal.  Skin:       No rashes  Psychiatric: He has a normal mood and affect. His behavior is normal.  Assessment & Plan:  Annual physical examination 1. MS (multiple sclerosis)    2. Mood disorder    3. Hyperlipidemia    4. Essential hypertension, benign    5. Hearing loss    6. Edema  ? Secondary to Norvasc  7. BETremor    His meds are up to date and only needs refills on Wellbutrin that he is aware of His wife will check the medications tonight  and send me a message if he needs things refilled over the next 6 months  Patient Instructions  Stop norvasc to see if edema goes away Take BP daily-if Bp goes up to be consistently above 135/85 then start the Hyzaar 50/12.5   Recheck 6 months

## 2011-12-06 NOTE — Patient Instructions (Signed)
Stop norvasc to see if edema goes away Take BP daily-if Bp goes up to be consistently above 135/85 then start the Hyzaar 50/12.5

## 2011-12-07 ENCOUNTER — Ambulatory Visit: Payer: Medicare Other | Admitting: Occupational Therapy

## 2011-12-07 ENCOUNTER — Ambulatory Visit: Payer: Medicare Other | Admitting: Physical Therapy

## 2011-12-07 LAB — LIPID PANEL
HDL: 52 mg/dL (ref >39)
LDL Cholesterol: 68 mg/dL (ref 0–99)
Total CHOL/HDL Ratio: 3.1 Ratio
VLDL: 41 mg/dL — ABNORMAL HIGH (ref 0–40)

## 2011-12-07 LAB — COMPREHENSIVE METABOLIC PANEL
ALT: 33 U/L (ref 0–53)
Alkaline Phosphatase: 94 U/L (ref 39–117)
CO2: 24 mEq/L (ref 19–32)
Creat: 1.16 mg/dL (ref 0.50–1.35)
Total Bilirubin: 0.5 mg/dL (ref 0.3–1.2)

## 2011-12-09 ENCOUNTER — Encounter: Payer: Self-pay | Admitting: Internal Medicine

## 2011-12-11 ENCOUNTER — Ambulatory Visit: Payer: Medicare Other | Admitting: Physical Therapy

## 2011-12-13 ENCOUNTER — Telehealth: Payer: Self-pay

## 2011-12-13 ENCOUNTER — Ambulatory Visit: Payer: Medicare Other | Admitting: *Deleted

## 2011-12-13 ENCOUNTER — Ambulatory Visit: Payer: Medicare Other | Admitting: Physical Therapy

## 2011-12-13 NOTE — Telephone Encounter (Signed)
Dr. Sandria Manly with [facility] called to request recent labs done at our office. Please fax to [phone].  Office number is [phone].

## 2011-12-13 NOTE — Telephone Encounter (Signed)
Records sent thru Epic to Dr. Sandria Manly.

## 2011-12-18 ENCOUNTER — Ambulatory Visit: Payer: Medicare Other | Admitting: Physical Therapy

## 2011-12-19 ENCOUNTER — Ambulatory Visit: Payer: Medicare Other | Admitting: Physical Therapy

## 2011-12-22 ENCOUNTER — Ambulatory Visit: Payer: Medicare Other | Admitting: Physical Therapy

## 2011-12-25 ENCOUNTER — Ambulatory Visit: Payer: Medicare Other | Admitting: Physical Therapy

## 2011-12-25 ENCOUNTER — Other Ambulatory Visit: Payer: Self-pay | Admitting: Internal Medicine

## 2011-12-29 ENCOUNTER — Ambulatory Visit: Payer: Medicare Other | Admitting: Physical Therapy

## 2012-01-02 ENCOUNTER — Ambulatory Visit: Payer: Medicare Other | Admitting: Physical Therapy

## 2012-01-02 ENCOUNTER — Ambulatory Visit: Payer: Medicare Other | Admitting: Occupational Therapy

## 2012-01-04 ENCOUNTER — Encounter: Payer: Medicare Other | Admitting: Occupational Therapy

## 2012-01-09 ENCOUNTER — Ambulatory Visit: Payer: Medicare Other | Attending: Neurology | Admitting: Physical Therapy

## 2012-01-09 DIAGNOSIS — IMO0001 Reserved for inherently not codable concepts without codable children: Secondary | ICD-10-CM | POA: Insufficient documentation

## 2012-01-09 DIAGNOSIS — R262 Difficulty in walking, not elsewhere classified: Secondary | ICD-10-CM | POA: Insufficient documentation

## 2012-01-09 DIAGNOSIS — M6281 Muscle weakness (generalized): Secondary | ICD-10-CM | POA: Insufficient documentation

## 2012-01-11 ENCOUNTER — Ambulatory Visit: Payer: Medicare Other | Admitting: Physical Therapy

## 2012-02-06 ENCOUNTER — Encounter: Payer: Self-pay | Admitting: Diagnostic Neuroimaging

## 2012-02-08 ENCOUNTER — Other Ambulatory Visit: Payer: Self-pay | Admitting: Neurology

## 2012-02-08 DIAGNOSIS — IMO0002 Reserved for concepts with insufficient information to code with codable children: Secondary | ICD-10-CM

## 2012-02-08 DIAGNOSIS — M5137 Other intervertebral disc degeneration, lumbosacral region: Secondary | ICD-10-CM

## 2012-02-13 ENCOUNTER — Ambulatory Visit
Admission: RE | Admit: 2012-02-13 | Discharge: 2012-02-13 | Disposition: A | Discharge status: Home / Self Care | Payer: Medicare Other | Source: Ambulatory Visit | Attending: Neurology | Admitting: Neurology

## 2012-02-13 DIAGNOSIS — IMO0002 Reserved for concepts with insufficient information to code with codable children: Secondary | ICD-10-CM

## 2012-02-13 DIAGNOSIS — M5137 Other intervertebral disc degeneration, lumbosacral region: Secondary | ICD-10-CM

## 2012-02-17 ENCOUNTER — Other Ambulatory Visit: Payer: Self-pay

## 2012-03-04 ENCOUNTER — Other Ambulatory Visit: Payer: Self-pay | Admitting: Internal Medicine

## 2012-03-04 ENCOUNTER — Ambulatory Visit (INDEPENDENT_AMBULATORY_CARE_PROVIDER_SITE_OTHER): Payer: Medicare Other | Admitting: Internal Medicine

## 2012-03-04 DIAGNOSIS — K921 Melena: Secondary | ICD-10-CM

## 2012-03-04 DIAGNOSIS — R197 Diarrhea, unspecified: Secondary | ICD-10-CM

## 2012-03-04 LAB — POCT CBC
HCT, POC: 47.9 % (ref 43.5–53.7)
Hemoglobin: 15.5 g/dL (ref 14.1–18.1)
Lymph, poc: 2.2 (ref 0.6–3.4)
MCH, POC: 29.7 pg (ref 27–31.2)
MCHC: 32.4 g/dL (ref 31.8–35.4)
MCV: 91.8 fL (ref 80–97)
POC Granulocyte: 12.1 — AB (ref 2–6.9)
POC LYMPH PERCENT: 14.2 %L (ref 10–50)
RDW, POC: 13.7 %
WBC: 15.2 10*3/uL — AB (ref 4.6–10.2)

## 2012-03-04 LAB — POCT SEDIMENTATION RATE: POCT SED RATE: 6 mm/hr (ref 0–22)

## 2012-03-04 LAB — IFOBT (OCCULT BLOOD): IFOBT: POSITIVE

## 2012-03-04 MED ORDER — CIPROFLOXACIN HCL 500 MG PO TABS: 500.0000 mg | ORAL_TABLET | Freq: Two times a day (BID) | ORAL | Status: DC

## 2012-03-04 NOTE — Progress Notes (Signed)
  Subjective:    Patient ID: Reginald Edwards, male    DOB: December 07, 1945, 67 y.o.   MRN: [id number]  HPI Acute diarrhea started yesterday with a question of a low-grade fever. Got up thorough out the night 10-15 with this and noticed bright red blood. Thinks he might have a hemorrhoid. No nausea or vomiting. No hx of hemorrhoids. The diarrhea has slowed down and almost stopped at this point. This is the first time he has ever had bright red blood with diarrhea or bowel movement. Has not had any changes in meds for MS.  No skin or joint changes/no prior history of melena or hematochezia Now has vague abdominal discomfort in the lower quadrants that is mild. Poor appetite.   Review of Systems No fever chills or night sweats prior to this   no genitourinary changes Objective:   Physical Exam Vital signs stable Abdomen soft nontender nondistended with no masses organomegaly Rectal examination reveals no external hemorrhoids or fissures Digital examination reveals no masses or tenderness although mucous is heme positive  Results for orders placed in visit on 03/04/12  IFOBT (OCCULT BLOOD)      Result Value Range   IFOBT Positive    POCT CBC      Result Value Range   WBC 15.2 (*) 4.6 - 10.2 K/uL   Lymph, poc 2.2  0.6 - 3.4   POC LYMPH PERCENT 14.2  10 - 50 %L   MID (cbc) 1.0 (*) 0 - 0.9   POC MID % 6.5  0 - 12 %M   POC Granulocyte 12.1 (*) 2 - 6.9   Granulocyte percent 79.3  37 - 80 %G   RBC 5.22  4.69 - 6.13 M/uL   Hemoglobin 15.5  14.1 - 18.1 g/dL   HCT, POC 40.9  81.1 - 53.7 %   MCV 91.8  80 - 97 fL   MCH, POC 29.7  27 - 31.2 pg   MCHC 32.4  31.8 - 35.4 g/dL   RDW, POC 91.4     Platelet Count, POC 265  142 - 424 K/uL   MPV 9.6  0 - 99.8 fL  POCT SEDIMENTATION RATE      Result Value Range   POCT SED RATE 6  0 - 22 mm/hr       Assessment & Plan:  Problem #1 acute diarrhea Problem #2 hematochezia Problem #3 leukocytosis  Stool culture  Ordered Start Cipro when  collected Push fluids/small soft diet

## 2012-03-08 ENCOUNTER — Encounter: Payer: Self-pay | Admitting: Internal Medicine

## 2012-03-15 ENCOUNTER — Other Ambulatory Visit: Payer: Self-pay

## 2012-03-15 MED ORDER — ATORVASTATIN CALCIUM 20 MG PO TABS: 20.0000 mg | ORAL_TABLET | Freq: Every day | ORAL | Status: DC

## 2012-03-20 ENCOUNTER — Ambulatory Visit (INDEPENDENT_AMBULATORY_CARE_PROVIDER_SITE_OTHER): Payer: Medicare Other | Admitting: Diagnostic Neuroimaging

## 2012-03-20 ENCOUNTER — Encounter: Payer: Self-pay | Admitting: Diagnostic Neuroimaging

## 2012-03-20 VITALS — BP 116/78 | HR 100 | Temp 98.1°F | Ht 68.0 in | Wt 199.5 lb

## 2012-03-20 DIAGNOSIS — M5416 Radiculopathy, lumbar region: Secondary | ICD-10-CM

## 2012-03-20 DIAGNOSIS — G35D Multiple sclerosis, unspecified: Secondary | ICD-10-CM | POA: Insufficient documentation

## 2012-03-20 DIAGNOSIS — M48061 Spinal stenosis, lumbar region without neurogenic claudication: Secondary | ICD-10-CM

## 2012-03-20 DIAGNOSIS — G35 Multiple sclerosis: Secondary | ICD-10-CM | POA: Insufficient documentation

## 2012-03-20 DIAGNOSIS — IMO0002 Reserved for concepts with insufficient information to code with codable children: Secondary | ICD-10-CM
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
## 2012-03-20 NOTE — Patient Instructions (Signed)
MRI brain in June 2014. Continue tecfidera. F/u with Neurosurgery re: MRI lumbar spine.

## 2012-03-30 ENCOUNTER — Other Ambulatory Visit: Payer: Medicare Other

## 2012-04-10 DIAGNOSIS — IMO0002 Reserved for concepts with insufficient information to code with codable children: Secondary | ICD-10-CM | POA: Diagnosis not present

## 2012-04-10 DIAGNOSIS — M48061 Spinal stenosis, lumbar region without neurogenic claudication: Secondary | ICD-10-CM | POA: Diagnosis not present

## 2012-04-10 DIAGNOSIS — M545 Low back pain: Secondary | ICD-10-CM | POA: Diagnosis not present

## 2012-04-10 DIAGNOSIS — M4310 Spondylolisthesis, site unspecified: Secondary | ICD-10-CM | POA: Diagnosis not present

## 2012-04-15 DIAGNOSIS — Z85820 Personal history of malignant melanoma of skin: Secondary | ICD-10-CM | POA: Diagnosis not present

## 2012-04-15 DIAGNOSIS — D1801 Hemangioma of skin and subcutaneous tissue: Secondary | ICD-10-CM | POA: Diagnosis not present

## 2012-04-15 DIAGNOSIS — L821 Other seborrheic keratosis: Secondary | ICD-10-CM | POA: Diagnosis not present

## 2012-04-15 DIAGNOSIS — Z85828 Personal history of other malignant neoplasm of skin: Secondary | ICD-10-CM | POA: Diagnosis not present

## 2012-04-16 ENCOUNTER — Other Ambulatory Visit: Payer: Self-pay

## 2012-04-16 MED ORDER — FLUOXETINE HCL 40 MG PO CAPS: 40.0000 mg | ORAL_CAPSULE | Freq: Every day | ORAL | Status: DC

## 2012-04-16 MED ORDER — LOSARTAN POTASSIUM-HCTZ 50-12.5 MG PO TABS: 1.0000 | ORAL_TABLET | Freq: Every day | ORAL | Status: DC

## 2012-04-16 MED ORDER — ATORVASTATIN CALCIUM 20 MG PO TABS: 20.0000 mg | ORAL_TABLET | Freq: Every day | ORAL | Status: DC

## 2012-04-25 ENCOUNTER — Other Ambulatory Visit: Payer: Self-pay | Admitting: Radiology

## 2012-04-25 MED ORDER — FLUOXETINE HCL 40 MG PO CAPS: 40.0000 mg | ORAL_CAPSULE | Freq: Every day | ORAL | Status: DC

## 2012-04-25 NOTE — Telephone Encounter (Signed)
Spoke to pharmacy about fluoxetine, it was not rc'd, have resent.

## 2012-05-24 DIAGNOSIS — H40009 Preglaucoma, unspecified, unspecified eye: Secondary | ICD-10-CM | POA: Diagnosis not present

## 2012-06-12 DIAGNOSIS — N3941 Urge incontinence: Secondary | ICD-10-CM | POA: Diagnosis not present

## 2012-06-12 DIAGNOSIS — R39198 Other difficulties with micturition: Secondary | ICD-10-CM | POA: Diagnosis not present

## 2012-06-14 DIAGNOSIS — G35 Multiple sclerosis: Secondary | ICD-10-CM | POA: Diagnosis not present

## 2012-06-17 ENCOUNTER — Ambulatory Visit
Admission: RE | Admit: 2012-06-17 | Discharge: 2012-06-17 | Disposition: A | Discharge status: Home / Self Care | Payer: Medicare Other | Source: Ambulatory Visit | Attending: Diagnostic Neuroimaging | Admitting: Diagnostic Neuroimaging

## 2012-06-17 DIAGNOSIS — G35 Multiple sclerosis: Secondary | ICD-10-CM

## 2012-06-17 MED ORDER — GADOBENATE DIMEGLUMINE 529 MG/ML IV SOLN
19.0000 mL | Freq: Once | INTRAVENOUS | Status: AC | PRN
  Administered 2012-06-17: 19 mL via INTRAVENOUS

## 2012-07-01 DIAGNOSIS — L821 Other seborrheic keratosis: Secondary | ICD-10-CM | POA: Diagnosis not present

## 2012-07-17 ENCOUNTER — Encounter: Payer: Self-pay | Admitting: Diagnostic Neuroimaging

## 2012-07-17 ENCOUNTER — Ambulatory Visit (INDEPENDENT_AMBULATORY_CARE_PROVIDER_SITE_OTHER): Payer: Medicare Other | Admitting: Diagnostic Neuroimaging

## 2012-07-17 VITALS — BP 134/77 | HR 111 | Temp 98.0°F | Ht 68.25 in | Wt 205.0 lb

## 2012-07-17 DIAGNOSIS — G35 Multiple sclerosis: Secondary | ICD-10-CM
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
## 2012-07-17 NOTE — Patient Instructions (Signed)
Continue tecfidera. 

## 2012-08-07 ENCOUNTER — Other Ambulatory Visit: Payer: Self-pay

## 2012-08-21 ENCOUNTER — Telehealth: Payer: Self-pay

## 2012-08-21 MED ORDER — LIDOCAINE 5 % EX PTCH: 1.0000 | MEDICATED_PATCH | CUTANEOUS | Status: DC

## 2012-08-21 NOTE — Telephone Encounter (Signed)
Called pt, advised Rx sent to pharmacy.

## 2012-08-21 NOTE — Telephone Encounter (Signed)
What would we be treating??

## 2012-08-21 NOTE — Telephone Encounter (Signed)
Does he need to RTC? Please advise

## 2012-08-21 NOTE — Telephone Encounter (Signed)
Called pt, he will be using them for his hamstrings. He has multiple sclerosis and sometimes when he walks his hamstrings just get really sore. He says the lidoderm helps this a lot.

## 2012-08-21 NOTE — Telephone Encounter (Signed)
PT STATES HE WAS GIVEN A LIDODERM PATCH ABOUT 2 YEARS AGO FROM DR Merla Riches AND WOULD LIKE TO KNOW IF HE CAN GET A REFILL ON IT AGAIN PLEASE CALL [phone]    [facility]

## 2012-08-24 ENCOUNTER — Other Ambulatory Visit: Payer: Self-pay | Admitting: Internal Medicine

## 2012-08-27 ENCOUNTER — Telehealth: Payer: Self-pay

## 2012-08-27 NOTE — Telephone Encounter (Signed)
pts wife dropped form off at 104 for dr Merla Riches. Its an rx renewal form for [facility].   Sending form through inter office mail to [employer].   Bf

## 2012-08-28 ENCOUNTER — Telehealth: Payer: Self-pay

## 2012-08-28 ENCOUNTER — Other Ambulatory Visit: Payer: Self-pay | Admitting: Physician Assistant

## 2012-08-28 NOTE — Telephone Encounter (Signed)
Called pt to report that we sent in a 30 day supply of Hyzaar to [facility] on 08/24/12 but that we didn't send the 90 day d/t being due for f/up.

## 2012-08-29 NOTE — Telephone Encounter (Signed)
See note under next phone message. Rx was sent.

## 2012-09-23 ENCOUNTER — Other Ambulatory Visit: Payer: Self-pay | Admitting: Physician Assistant

## 2012-09-24 NOTE — Telephone Encounter (Signed)
Patient wants to start the medication for tremors.  [facility] pharmacy Patient has MS.   [phone]

## 2012-09-24 NOTE — Telephone Encounter (Signed)
Pt has appt for CPE set up for 12/18/12. Can we RF these Rxs until then?

## 2012-10-09 DIAGNOSIS — M4310 Spondylolisthesis, site unspecified: Secondary | ICD-10-CM | POA: Diagnosis not present

## 2012-10-09 DIAGNOSIS — M545 Low back pain: Secondary | ICD-10-CM | POA: Diagnosis not present

## 2012-10-10 ENCOUNTER — Other Ambulatory Visit (HOSPITAL_COMMUNITY): Payer: Self-pay | Admitting: Neurological Surgery

## 2012-10-10 ENCOUNTER — Other Ambulatory Visit: Payer: Self-pay | Admitting: Neurological Surgery

## 2012-10-10 DIAGNOSIS — M4310 Spondylolisthesis, site unspecified: Secondary | ICD-10-CM

## 2012-10-11 DIAGNOSIS — IMO0001 Reserved for inherently not codable concepts without codable children: Secondary | ICD-10-CM | POA: Diagnosis not present

## 2012-10-17 ENCOUNTER — Encounter (HOSPITAL_COMMUNITY): Payer: Self-pay

## 2012-10-21 ENCOUNTER — Ambulatory Visit (HOSPITAL_COMMUNITY)
Admission: RE | Admit: 2012-10-21 | Discharge: 2012-10-21 | Disposition: A | Discharge status: Home / Self Care | Payer: Medicare Other | Source: Ambulatory Visit | Attending: Neurological Surgery | Admitting: Neurological Surgery

## 2012-10-21 DIAGNOSIS — R29898 Other symptoms and signs involving the musculoskeletal system: Secondary | ICD-10-CM | POA: Insufficient documentation

## 2012-10-21 DIAGNOSIS — M4310 Spondylolisthesis, site unspecified: Secondary | ICD-10-CM

## 2012-10-21 DIAGNOSIS — S32009A Unspecified fracture of unspecified lumbar vertebra, initial encounter for closed fracture: Secondary | ICD-10-CM | POA: Diagnosis not present

## 2012-10-21 DIAGNOSIS — M48061 Spinal stenosis, lumbar region without neurogenic claudication: Secondary | ICD-10-CM | POA: Diagnosis not present

## 2012-10-21 DIAGNOSIS — M5126 Other intervertebral disc displacement, lumbar region: Secondary | ICD-10-CM | POA: Diagnosis not present

## 2012-10-21 DIAGNOSIS — X58XXXA Exposure to other specified factors, initial encounter: Secondary | ICD-10-CM | POA: Insufficient documentation

## 2012-10-21 MED ORDER — IOHEXOL 180 MG/ML  SOLN
20.0000 mL | Freq: Once | INTRAMUSCULAR | Status: AC | PRN
  Administered 2012-10-21: 13 mL via INTRATHECAL

## 2012-10-21 MED ORDER — ONDANSETRON HCL 4 MG/2ML IJ SOLN: 4.0000 mg | Freq: Four times a day (QID) | INTRAMUSCULAR | Status: DC | PRN

## 2012-10-21 MED ORDER — HYDROCODONE-ACETAMINOPHEN 5-325 MG PO TABS: 1.0000 | ORAL_TABLET | ORAL | Status: DC | PRN

## 2012-10-21 NOTE — Procedures (Signed)
HOPI:                                                   Reginald Edwards returns to the office today.  I last visited with him in April when I assessed his MRI of the lumbar spine with grade 2 spondylolisthesis with lower lumbar spine and some degenerative changes also noted at L2-3.  Today, [name] returns for further evaluation.  He has new radiographs of the lumbar spine which again measures scoliosis, apex to the left measuring approximately 11 degrees.  Previously, I measured this to 12 degrees.  He has degenerative changes with spondylolisthesis grade 2 at L5-S1.  He has partial lumbarization of this presacral vertebrae.  He also has advanced degenerative changes at L2-3.  Clinically, [name] notes that recently in the past few days he has developed some back pain.  He has however felt further weakness of his legs and stamina on his feet that has been bothering him.  He notes that he gets around with a walker in the house, but when he travels anywhere he needs to use a wheelchair.  He also notes that his activity level has been significantly limited because of his capacity to move about.  Plans have been changed.  He notes that he was planning a trip to [place], but this has been put on hold because of his inability to walk any significant distances.  He has had a longstanding right-sided footdrop, but overall he feels  that his stamina on his legs has been worsening.  He just recently started developing some increased back pain.  DATA:                                                  Today in the office, new AP and lateral radiographs of the lumbar spine demonstrate the degenerative change at L5-S1 with spondylolisthesis.  There is a little change from the previous MRI studies that were completed in February.  He does have moderate degrees of spondylotic stenosis noted on that MRI.  IMPRESSION/PLAN:                             The patient has had continuing slow progressive course of further  deterioration.  EMG and nerve conduction studies revealed presence of some positive fibrillation suggesting more of a peripheral nerve process.  The patient has been diagnosed with MS and he has been undergoing treatment for this process.  He initially was worked up by Dr. Melbourne Abts and Dr. Marjory Lies has since taken over his care, concerned and considering whether he should be treated on some new medications.  At this point, the patient appears to have progressive deterioration in his level of function despite what appears to be fairly good stability in his lumbar spine.  He does have peripheral neuropathy indicated by his EMG nerve conduction study and if this is indeed emblematic of his problem, I believe a myelogram and postmyelogram CAT scan should be performed.  If the stenosis worsens and is particularly significant with him weightbearing, he may ultimately need to consider surgical decompression of this process in an effort  to reverse it and stabilize it.  We will plan on scheduling the myelogram at the earliest convenience.  I discussed with him and his wife the nature and technique of the myelogram.  Typically, this has done as an outpatient at the hospital.  The main consideration for the patient is that he drink plenty of fluids during the postmyelogram time for the next day to minimize the chance of any reaction from a dye or cause of a headache.  We will plan on scheduling the myelogram at the earliest convenience.   Pre op Dx: Lumbar spondylolisthesis with radiculopathy Post op Dx: Lumbar spondylolisthesis with radiculopathy Procedure: Lumbar myelogram Surgeon: Nole Robey Puncture level: L2-3 Fluid color: Clear colorless Injection: Iohexol 180, 13 cc Findings: Severe spondylitic stenosis L3-4 L4-5 L5-S1, for CT evaluation

## 2012-10-21 NOTE — Progress Notes (Signed)
Discharge instruction given per MD order.  Pt and CG able to verbalize understanding.  Pt to car with family at side.

## 2012-10-21 NOTE — Progress Notes (Signed)
Pt received from  Radiology alert and oriented.  Denies any discomfort at this time.  Pt able to tolerated fluids.

## 2012-10-25 ENCOUNTER — Telehealth: Payer: Self-pay

## 2012-10-25 NOTE — Telephone Encounter (Signed)
Patient says that he now has to have 90 day supply of his cyclobenzaprin on his  Upcoming refills in order for his ins to cover it  Please call [phone] if any questions

## 2012-10-25 NOTE — Telephone Encounter (Signed)
Pended please advise.  

## 2012-10-27 MED ORDER — CYCLOBENZAPRINE HCL 10 MG PO TABS: 10.0000 mg | ORAL_TABLET | Freq: Three times a day (TID) | ORAL | Status: DC | PRN

## 2012-11-07 ENCOUNTER — Other Ambulatory Visit: Payer: Self-pay

## 2012-11-15 ENCOUNTER — Ambulatory Visit (INDEPENDENT_AMBULATORY_CARE_PROVIDER_SITE_OTHER): Payer: Medicare Other | Admitting: Internal Medicine

## 2012-11-15 VITALS — BP 112/72 | HR 81 | Temp 98.0°F | Resp 16 | Ht 68.5 in | Wt 209.0 lb

## 2012-11-15 DIAGNOSIS — L02419 Cutaneous abscess of limb, unspecified: Secondary | ICD-10-CM

## 2012-11-15 DIAGNOSIS — Z23 Encounter for immunization: Secondary | ICD-10-CM

## 2012-11-15 DIAGNOSIS — L03116 Cellulitis of left lower limb: Secondary | ICD-10-CM

## 2012-11-15 LAB — POCT CBC
Granulocyte percent: 74.1 %G (ref 37–80)
HCT, POC: 41.6 % — AB (ref 43.5–53.7)
Lymph, poc: 1.4 (ref 0.6–3.4)
MCH, POC: 29.1 pg (ref 27–31.2)
MID (cbc): 0.6 (ref 0–0.9)
POC Granulocyte: 5.7 (ref 2–6.9)
POC LYMPH PERCENT: 18.1 %L (ref 10–50)
Platelet Count, POC: 218 10*3/uL (ref 142–424)
RBC: 4.43 M/uL — AB (ref 4.69–6.13)
RDW, POC: 14.1 %

## 2012-11-15 MED ORDER — DOXYCYCLINE HYCLATE 100 MG PO TABS: 100.0000 mg | ORAL_TABLET | Freq: Two times a day (BID) | ORAL | Status: DC

## 2012-11-15 MED ORDER — MUPIROCIN 2 % EX OINT: 1.0000 "application " | TOPICAL_OINTMENT | Freq: Three times a day (TID) | CUTANEOUS | Status: DC

## 2012-11-15 NOTE — Progress Notes (Signed)
  Subjective:    Patient ID: Reginald Edwards, male    DOB: [birth year], 67 y.o.   MRN: [id number]  HPI 67 year old man presents to clinic today with a small circular nodule on his left lower leg. He states he noticed it about 5 days ago. Since then he has noticed increasing redness tracking down toward his Lt ankle. Area is warm to the touch. Pt denies fever and body aches. Pt states there has not been any drainage from the site. Has small puncture wound  Review of Systems     Objective:   Physical Exam  Vitals reviewed. Constitutional: He is oriented to person, place, and time. He appears well-developed and well-nourished.  HENT:  Head: Normocephalic.  Eyes: No scleral icterus.  Pulmonary/Chest: Effort normal.  Musculoskeletal: Normal range of motion.  Neurological: He is alert and oriented to person, place, and time. He exhibits normal muscle tone. Coordination normal.  Skin: Abrasion and rash noted. Rash is macular. Rash is not pustular. There is erythema.     Psychiatric: He has a normal mood and affect.     Results for orders placed in visit on 11/15/12  POCT CBC      Result Value Range   WBC 7.7  4.6 - 10.2 K/uL   Lymph, poc 1.4  0.6 - 3.4   POC LYMPH PERCENT 18.1  10 - 50 %L   MID (cbc) 0.6  0 - 0.9   POC MID % 7.8  0 - 12 %M   POC Granulocyte 5.7  2 - 6.9   Granulocyte percent 74.1  37 - 80 %G   RBC 4.43 (*) 4.69 - 6.13 M/uL   Hemoglobin 12.9 (*) 14.1 - 18.1 g/dL   HCT, POC 84.6 (*) 96.2 - 53.7 %   MCV 94.0  80 - 97 fL   MCH, POC 29.1  27 - 31.2 pg   MCHC 31.0 (*) 31.8 - 35.4 g/dL   RDW, POC 95.2     Platelet Count, POC 218  142 - 424 K/uL   MPV 9.1  0 - 99.8 fL        Assessment & Plan:  Cellulitis leg Doxy/mupirocin

## 2012-11-15 NOTE — Patient Instructions (Addendum)
Community-Associated MRSA CA-MRSA stands for community-associated methicillin-resistant Staphylococcus aureus. MRSA is a type of bacteria that is resistant to some common antibiotics. It can cause infections in the skin and many other places in the body. Staphylococcus aureus, often called "staph," is a bacteria that normally lives on the skin or in the nose. Staph on the surface of the skin or in the nose does not cause problems. However, if the staph enters the body through a cut, wound, or break in the skin, an infection can happen. Up until recently, infections with the MRSA type of staph mainly occurred in hospitals and other healthcare settings. There are now increasing problems with MRSA infections in the community as well. Infections with MRSA may be very serious or even life-threatening. CA-MRSA is becoming more common. It is known to spread in crowded settings, in jails and prisons, and in situations where there is close skin-to-skin contact, such as during sporting events or in locker rooms. MRSA can be spread through shared items, such as children's toys, razors, towels, or sports equipment.  CAUSES All staph, including MRSA, are normally harmless unless they enter the body through a scratch, cut, or wound, such as with surgery. All staph, including MRSA, can be spread from person-to-person by touching contaminated objects or through direct contact.  MRSA now causes illness in people who have not been in hospitals or other healthcare facilities. Cases of MRSA diseases in the community have been associated with:  Recent antibiotic use.  Sharing contaminated towels or clothes.  Having active skin diseases.  Participating in contact sports.  Living in crowded settings.  Intravenous (IV) drug use.  Community-associated MRSA infections are usually skin infections, but may cause other severe illnesses.  Staph bacteria are one of the most common causes of skin infection. However, they are  also a common cause of pneumonia, bone or joint infections, and bloodstream infections. DIAGNOSIS Diagnosis of MRSA is done by cultures of fluid samples that may come from:  Swabs taken from cuts or wounds in infected areas.  Nasal swabs.  Saliva or deep cough specimens from the lungs (sputum).  Urine.  Blood. Many people are "colonized" with MRSA but have no signs of infection. This means that people carry the MRSA germ on their skin or in their nose and may never develop MRSA infection.  TREATMENT  Treatment varies and is based on how serious, how deep, or how extensive the infection is. For example:  Some skin infections, such as a small boil or abscess, may be treated by draining yellowish-white fluid (pus) from the site of the infection.  Deeper or more widespread soft tissue infections are usually treated with surgery to drain pus and with antibiotic medicine given by vein or by mouth. This may be recommended even if you are pregnant.  Serious infections may require a hospital stay. If antibiotics are given, they may be needed for several weeks. PREVENTION Because many people are colonized with staph, including MRSA, preventing the spread of the bacteria from person-to-person is most important. The best way to prevent the spread of bacteria and other germs is through proper hand washing or by using alcohol-based hand disinfectants. The following are other ways to help prevent MRSA infection within community settings.   Wash your hands frequently with soap and water for at least 15 seconds. Otherwise, use alcohol-based hand disinfectants when soap and water is not available.  Make sure people who live with you wash their hands often, too.  Do not share  personal items. For example, avoid sharing razors and other personal hygiene items, towels, clothing, and athletic equipment.  Wash and dry your clothes and bedding at the warmest temperatures recommended on the labels.  Keep  wounds covered. Pus from infected sores may contain MRSA and other bacteria. Keep cuts and abrasions clean and covered with germ-free (sterile), dry bandages until they are healed.  If you have a wound that appears infected, ask your caregiver if a culture for MRSA and other bacteria should be done.  If you are breastfeeding, talk to your caregiver about MRSA. You may be asked to temporarily stop breastfeeding. HOME CARE INSTRUCTIONS   Take your antibiotics as directed. Finish them even if you start to feel better.  Avoid close contact with those around you as much as possible. Do not use towels, razors, toothbrushes, bedding, or other items that will be used by others.  To fight the infection, follow your caregiver's instructions for wound care. Wash your hands before and after changing your bandages.  If you have an intravascular device, such as a catheter, make sure you know how to care for it.  Be sure to tell any healthcare providers that you have MRSA so they are aware of your infection. SEEK IMMEDIATE MEDICAL CARE IF:  The infection appears to be getting worse. Signs include:  Increased warmth, redness, or tenderness around the wound site.  A red line that extends from the infection site.  A dark color in the area around the infection.  Wound drainage that is tan, yellow, or green.  A bad smell coming from the wound.  You feel sick to your stomach (nauseous) and throw up (vomit) or cannot keep medicine down.  You have a fever.  Your baby is older than 3 months with a rectal temperature of 102 F (38.9 C) or higher.  Your baby is 6 months old or younger with a rectal temperature of 100.4 F (38 C) or higher.  You have difficulty breathing. MAKE SURE YOU:   Understand these instructions.  Will watch your condition.  Will get help right away if you are not doing well or get worse. Document Released: 03/24/2005 Document Revised: 03/13/2011 Document Reviewed:  03/24/2010 [facility] Patient Information 2014 [place], [place]. MRSA Overview MRSA stands for methicillin-resistant Staphylococcus aureus. It is a type of bacteria that is resistant to some common antibiotics. It can cause infections in the skin and many other places in the body. Staphylococcus aureus, often called "staph," is a bacteria that normally lives on the skin or in the nose. Staph on the surface of the skin or in the nose does not cause problems. However, if the staph enters the body through a cut, wound, or break in the skin, an infection can happen. Up until recently, infections with the MRSA type of staph mainly occurred in hospitals and other healthcare settings. There are now increasing problems with MRSA infections in the community as well. Infections with MRSA may be very serious or even life-threatening. Most MRSA infections are acquired in one of two ways:  Healthcare-associated MRSA (HA-MRSA)  This can be acquired by people in any healthcare setting. MRSA can be a big problem for hospitalized people, people in nursing homes, people in rehabilitation facilities, people with weakened immune systems, dialysis patients, and those who have had surgery.  Community-associated MRSA (CA-MRSA)  Community spread of MRSA is becoming more common. It is known to spread in crowded settings, in jails and prisons, and in situations where there is  close skin-to-skin contact, such as during sporting events or in locker rooms. MRSA can be spread through shared items, such as children's toys, razors, towels, or sports equipment. CAUSES  All staph, including MRSA, are normally harmless unless they enter the body through a scratch, cut, or wound, such as with surgery. All staph, including MRSA, can be spread from person-to-person by touching contaminated objects or through direct contact. SPECIAL GROUPS MRSA can present problems for special groups of people. Some of these groups include:  Breastfeeding  women.  The most common problem is MRSA infection of the breast (mastitis). There is evidence that MRSA can be passed to an infant from infected breast milk. Your caregiver may recommend that you stop breastfeeding until the mastitis is under control.  If you are breastfeeding and have a MRSA infection in a place other than the breast, you may usually continue breastfeeding while under treatment. If taking antibiotics, ask your caregiver if it is safe to continue breastfeeding while taking your prescribed medicines.  Neonates (babies from birth to 90 month old) and infants (babies from 84 month to 58 year old).  There is evidence that MRSA can be passed to a newborn at birth if the mother has MRSA on the skin, in or around the birth canal, or an infection in the uterus, cervix, or vagina. MRSA infection can have the same appearance as a normal newborn or infant rash or several other skin infections. This can make it hard to diagnose MRSA.  Immune compromised people.  If you have an immune system problem, you may have a higher chance of developing a MRSA infection.  People after any type of surgery.  Staph in general, including MRSA, is the most common cause of infections occurring at the site of recent surgery.  People on long-term steroid medicines.  These kinds of medicines can lower your resistance to infection. This can increase your chance of getting MRSA.  People who have had frequent hospitalizations, live in nursing homes or other residential care facilities, have venous or urinary catheters, or have taken multiple courses of antibiotic therapy for any reason. DIAGNOSIS  Diagnosis of MRSA is done by cultures of fluid samples that may come from:  Swabs taken from cuts or wounds in infected areas.  Nasal swabs.  Saliva or deep cough specimens from the lungs (sputum).  Urine.  Blood. Many people are "colonized" with MRSA but have no signs of infection. This means that people  carry the MRSA germ on their skin or in their nose and may never develop MRSA infection.  TREATMENT  Treatment varies and is based on how serious, how deep, or how extensive the infection is. For example:  Some skin infections, such as a small boil or abscess, may be treated by draining yellowish-white fluid (pus) from the site of the infection.  Deeper or more widespread soft tissue infections are usually treated with surgery to drain pus and with antibiotic medicine given by vein or by mouth. This may be recommended even if you are pregnant.  Serious infections may require a hospital stay. If antibiotics are given, they may be needed for several weeks. PREVENTION  Because many people are colonized with staph, including MRSA, preventing the spread of the bacteria from person-to-person is most important. The best way to prevent the spread of bacteria and other germs is through proper hand washing or by using alcohol-based hand disinfectants. The following are other ways to help prevent MRSA infection within the hospital and community  settings.   Healthcare settings:  Strict hand washing or hand disinfection procedures need to be followed before and after touching every patient.  Patients infected with MRSA are placed in isolation to prevent the spread of the bacteria.  Healthcare workers need to wear disposable gowns and gloves when touching or caring for patients infected with MRSA. Visitors may also be asked to wear a gown and gloves.  Hospital surfaces need to be disinfected frequently.  Community settings:  NIKE frequently with soap and water for at least 15 seconds. Otherwise, use alcohol-based hand disinfectants when soap and water is not available.  Make sure people who live with you wash their hands often, too.  Do not share personal items. For example, avoid sharing razors and other personal hygiene items, towels, clothing, and athletic equipment.  Wash and dry your  clothes and bedding at the warmest temperatures recommended on the labels.  Keep wounds covered. Pus from infected sores may contain MRSA and other bacteria. Keep cuts and abrasions clean and covered with germ-free (sterile), dry bandages until they are healed.  If you have a wound that appears infected, ask your caregiver if a culture for MRSA and other bacteria should be done.  If you are breastfeeding, talk to your caregiver about MRSA. You may be asked to temporarily stop breastfeeding. HOME CARE INSTRUCTIONS   Take your antibiotics as directed. Finish them even if you start to feel better.  Avoid close contact with those around you as much as possible. Do not use towels, razors, toothbrushes, bedding, or other items that will be used by others.  To fight the infection, follow your caregiver's instructions for wound care. Wash your hands before and after changing your bandages.  If you have an intravascular device, such as a catheter, make sure you know how to care for it.  Be sure to tell any healthcare providers that you have MRSA so they are aware of your infection. SEEK IMMEDIATE MEDICAL CARE IF:   The infection appears to be getting worse. Signs include:  Increased warmth, redness, or tenderness around the wound site.  A red line that extends from the infection site.  A dark color in the area around the infection.  Wound drainage that is tan, yellow, or green.  A bad smell coming from the wound.  You feel sick to your stomach (nauseous) and throw up (vomit) or cannot keep medicine down.  You have a fever.  Your baby is older than 3 months with a rectal temperature of 102 F (38.9 C) or higher.  Your baby is 37 months old or younger with a rectal temperature of 100.4 F (38 C) or higher.  You have difficulty breathing. MAKE SURE YOU:   Understand these instructions.  Will watch your condition.  Will get help right away if you are not doing well or get  worse. Document Released: 12/19/2004 Document Revised: 03/13/2011 Document Reviewed: 03/23/2010 [facility] Patient Information 2014 [place], [place]. Influenza A (H1N1) H1N1 formerly called "swine flu" is a new influenza virus causing sickness in people. The H1N1 virus is different from seasonal influenza viruses. However, the H1N1 symptoms are similar to seasonal influenza and it is spread from person to person. You may be at higher risk for serious problems if you have underlying serious medical conditions. The CDC and the [employer] are following reported cases around the world. CAUSES   The flu is thought to spread mainly person-to-person through coughing or sneezing of infected people.  A person may become infected by touching something with the virus on it and then touching their mouth or nose. SYMPTOMS   Fever.  Headache.  Tiredness.  Cough.  Sore throat.  Runny or stuffy nose.  Body aches.  Diarrhea and vomiting These symptoms are referred to as "flu-like symptoms." A lot of different illnesses, including the common cold, may have similar symptoms. DIAGNOSIS   There are tests that can tell if you have the H1N1 virus.  Confirmed cases of H1N1 will be reported to the state or local health department.  A doctor's exam may be needed to tell whether you have an infection that is a complication of the flu. HOME CARE INSTRUCTIONS   Stay informed. Visit the [facility] website for current recommendations. Visit [employer]. You may also call 1-800-CDC-INFO (8038093970).  Get help early if you develop any of the above symptoms.  If you are at high risk from complications of the flu, talk to your caregiver as soon as you develop flu-like symptoms. Those at higher risk for complications include:  People 65 years or older.  People with chronic medical conditions.  Pregnant women.  Young children.  Your caregiver may recommend antiviral medicine to help  treat the flu.  If you get the flu, get plenty of rest, drink enough water and fluids to keep your urine clear or pale yellow, and avoid using alcohol or tobacco.  You may take over-the-counter medicine to relieve the symptoms of the flu if your caregiver approves. (Never give aspirin to children or teenagers who have flu-like symptoms, particularly fever). TREATMENT  If you do get sick, antiviral drugs are available. These drugs can make your illness milder and make you feel better faster. Treatment should start soon after illness starts. It is only effective if taken within the first day of becoming ill. Only your caregiver can prescribe antiviral medication.  PREVENTION   Cover your nose and mouth with a tissue or your arm when you cough or sneeze. Throw the tissue away.  Wash your hands often with soap and warm water, especially after you cough or sneeze. Alcohol-based cleaners are also effective against germs.  Avoid touching your eyes, nose or mouth. This is one way germs spread.  Try to avoid contact with sick people. Follow public health advice regarding school closures. Avoid crowds.  Stay home if you get sick. Limit contact with others to keep from infecting them. People infected with the H1N1 virus may be able to infect others anywhere from 1 day before feeling sick to 5-7 days after getting flu symptoms.  An H1N1 vaccine is available to help protect against the virus. In addition to the H1N1 vaccine, you will need to be vaccinated for seasonal influenza. The H1N1 and seasonal vaccines may be given on the same day. The CDC especially recommends the H1N1 vaccine for:  Pregnant women.  People who live with or care for children younger than 12 months of age.  Health care and emergency services personnel.  Persons between the ages of 66 months through 29 years of age.  People from ages 80 through 72 years who are at higher risk for H1N1 because of chronic health disorders or immune  system problems. FACEMASKS In community and home settings, the use of facemasks and N95 respirators are not normally recommended. In certain circumstances, a facemask or N95 respirator may be used for persons at increased risk of severe illness from influenza. Your caregiver can give additional recommendations for facemask use. IN  CHILDREN, EMERGENCY WARNING SIGNS THAT NEED URGENT MEDICAL CARE:  Fast breathing or trouble breathing.  Bluish skin color.  Not drinking enough fluids.  Not waking up or not interacting normally.  Being so fussy that the child does not want to be held.  Your child has an oral temperature above 102 F (38.9 C), not controlled by medicine.  Your baby is older than 3 months with a rectal temperature of 102 F (38.9 C) or higher.  Your baby is 99 months old or younger with a rectal temperature of 100.4 F (38 C) or higher.  Flu-like symptoms improve but then return with fever and worse cough. IN ADULTS, EMERGENCY WARNING SIGNS THAT NEED URGENT MEDICAL CARE:  Difficulty breathing or shortness of breath.  Pain or pressure in the chest or abdomen.  Sudden dizziness.  Confusion.  Severe or persistent vomiting.  Bluish color.  You have a oral temperature above 102 F (38.9 C), not controlled by medicine.  Flu-like symptoms improve but return with fever and worse cough. SEEK IMMEDIATE MEDICAL CARE IF:  You or someone you know is experiencing any of the above symptoms. When you arrive at the emergency center, report that you think you have the flu. You may be asked to wear a mask and/or sit in a secluded area to protect others from getting sick. MAKE SURE YOU:   Understand these instructions.  Will watch your condition.  Will get help right away if you are not doing well or get worse. Some of this information courtesy of the CDC.  Document Released: 06/07/2007 Document Revised: 03/13/2011 Document Reviewed: 06/07/2007 [facility] Patient Information  2014 [place], [place].

## 2012-11-29 ENCOUNTER — Other Ambulatory Visit: Payer: Self-pay | Admitting: Internal Medicine

## 2012-11-29 NOTE — Telephone Encounter (Signed)
I sent in a 90d supply - he needs to make sure he comes to his appt.

## 2012-11-29 NOTE — Telephone Encounter (Signed)
Pt has appt scheduled for 12/18/12. Can we send these 90 day RFs?

## 2012-11-30 ENCOUNTER — Other Ambulatory Visit: Payer: Self-pay

## 2012-11-30 MED ORDER — DIMETHYL FUMARATE 240 MG PO CPDR: 240.0000 mg | DELAYED_RELEASE_CAPSULE | Freq: Two times a day (BID) | ORAL | Status: DC

## 2012-12-18 ENCOUNTER — Encounter: Payer: Self-pay | Admitting: Internal Medicine

## 2012-12-18 ENCOUNTER — Ambulatory Visit (INDEPENDENT_AMBULATORY_CARE_PROVIDER_SITE_OTHER): Payer: Medicare Other | Admitting: Internal Medicine

## 2012-12-18 VITALS — BP 130/78 | HR 86 | Temp 98.5°F | Resp 16 | Ht 68.0 in | Wt 206.0 lb

## 2012-12-18 DIAGNOSIS — M48061 Spinal stenosis, lumbar region without neurogenic claudication: Secondary | ICD-10-CM | POA: Diagnosis not present

## 2012-12-18 DIAGNOSIS — E785 Hyperlipidemia, unspecified: Secondary | ICD-10-CM

## 2012-12-18 DIAGNOSIS — D649 Anemia, unspecified: Secondary | ICD-10-CM

## 2012-12-18 DIAGNOSIS — Z125 Encounter for screening for malignant neoplasm of prostate: Secondary | ICD-10-CM

## 2012-12-18 DIAGNOSIS — M216X9 Other acquired deformities of unspecified foot: Secondary | ICD-10-CM | POA: Diagnosis not present

## 2012-12-18 DIAGNOSIS — Z0000 Encounter for general adult medical examination without abnormal findings: Secondary | ICD-10-CM | POA: Diagnosis not present

## 2012-12-18 DIAGNOSIS — M21371 Foot drop, right foot: Secondary | ICD-10-CM | POA: Insufficient documentation

## 2012-12-18 DIAGNOSIS — R350 Frequency of micturition: Secondary | ICD-10-CM

## 2012-12-18 DIAGNOSIS — IMO0002 Reserved for concepts with insufficient information to code with codable children: Secondary | ICD-10-CM | POA: Diagnosis not present

## 2012-12-18 DIAGNOSIS — G35 Multiple sclerosis: Secondary | ICD-10-CM

## 2012-12-18 DIAGNOSIS — M5416 Radiculopathy, lumbar region: Secondary | ICD-10-CM

## 2012-12-18 LAB — PSA: PSA: 0.53 ng/mL (ref <=4.00)

## 2012-12-18 LAB — COMPREHENSIVE METABOLIC PANEL
ALT: 25 U/L (ref 0–53)
AST: 27 U/L (ref 0–37)
Albumin: 4.4 g/dL (ref 3.5–5.2)
Alkaline Phosphatase: 73 U/L (ref 39–117)
BUN: 23 mg/dL (ref 6–23)
Calcium: 9.8 mg/dL (ref 8.4–10.5)
Chloride: 105 mEq/L (ref 96–112)
Creat: 1.04 mg/dL (ref 0.50–1.35)
Potassium: 5.1 mEq/L (ref 3.5–5.3)
Total Bilirubin: 0.4 mg/dL (ref 0.3–1.2)

## 2012-12-18 LAB — LIPID PANEL
HDL: 46 mg/dL (ref >39)
LDL Cholesterol: 65 mg/dL (ref 0–99)
Total CHOL/HDL Ratio: 2.9 Ratio
VLDL: 22 mg/dL (ref 0–40)

## 2012-12-18 LAB — POCT URINALYSIS DIPSTICK
Blood, UA: NEGATIVE
Leukocytes, UA: NEGATIVE
Nitrite, UA: NEGATIVE
Protein, UA: NEGATIVE
Spec Grav, UA: >=1.03
Urobilinogen, UA: 0.2
pH, UA: 6

## 2012-12-18 LAB — CBC
HCT: 41.9 % (ref 39.0–52.0)
MCHC: 33.2 g/dL (ref 30.0–36.0)
MCV: 86.6 fL (ref 78.0–100.0)
Platelets: 234 10*3/uL (ref 150–400)
RDW: 13.5 % (ref 11.5–15.5)
WBC: 9 10*3/uL (ref 4.0–10.5)

## 2012-12-18 NOTE — Progress Notes (Signed)
Subjective:    Patient ID: Reginald Edwards, male    DOB: 03-20-1945, 67 y.o.   MRN: [id number]  HPI Patient presents today for a CPE.  Colonoscopy 10/2008, due 2015 Tdap- 12/12 pnuemovax- 12/12  Continues to have symptoms from MS, sees neurologist every 6 months. Has an appointment next month. Having severe fatigue, requiring a nap after after showering. Pain does not interfere with rest, but he is unable to walk for short distances. Mood remains good.Has returned to coin collecting. Had several falls, this has decreased in frequency with use of walker and being careful with movement and balance.  Sleeps fairly well. Uses melatonin primarily, rarely requires restoril. Increasing constipation over last several years. Eats raisins and takes miralax if he misses a day. No blood in stool or dark stools. Rare nocturia, rapaflow helping with urine stream. No hematuria, no dysuria.   Has more difficulty finding his words. Has noticed decreased concentration, this is better with adderrall prescribed by Dr. Sharl Ma. Has difficulty with simple math problems and finding things on the computer.   Review of Systems  Constitutional: Positive for activity change and fatigue. Negative for fever, appetite change and unexpected weight change.        Wife describes him as removing 10-20 feet without being exhausted  HENT: Negative for dental problem and trouble swallowing.   Eyes: Negative for visual disturbance.  Respiratory: Negative for choking and shortness of breath.   Cardiovascular: Negative for chest pain, palpitations and leg swelling.  Gastrointestinal: Negative for abdominal pain, diarrhea, constipation and blood in stool.  Genitourinary: Negative for frequency, hematuria and scrotal swelling.  Neurological: Positive for weakness. Negative for dizziness, light-headedness and headaches.  Hematological: Negative for adenopathy.  Psychiatric/Behavioral: Negative for hallucinations, sleep  disturbance and agitation.       Objective:   Physical Exam  Constitutional: He is oriented to person, place, and time. He appears well-developed and well-nourished.  HENT:  Head: Normocephalic.  Right Ear: External ear normal.  Left Ear: External ear normal.  Nose: Nose normal.  Mouth/Throat: Oropharynx is clear and moist.  Tms and canals clear  Eyes: Conjunctivae and EOM are normal. Pupils are equal, round, and reactive to light.  Neck: Normal range of motion. Neck supple. No thyromegaly present.  Cardiovascular: Normal rate, regular rhythm, normal heart sounds and intact distal pulses.   No murmur heard. Pulmonary/Chest: Effort normal and breath sounds normal. No respiratory distress. He has no wheezes. He has no rales.  Abdominal: Soft. Bowel sounds are normal. He exhibits no distension and no mass. There is no tenderness. There is no rebound and no guarding.  No hepatosplenomegaly  Musculoskeletal: Normal range of motion. He exhibits no edema and no tenderness.  Right lower extremity foot drop  Lymphadenopathy:    He has no cervical adenopathy.  Neurological: He is alert and oriented to person, place, and time. No cranial nerve deficit.  Skin: Skin is warm and dry. No rash noted.          Assessment & Plan:  CPE Routine general medical examination at a health care facility - Plan: Comprehensive metabolic panel, PSA  Other and unspecified hyperlipidemia - Plan: Lipid panel  Anemia - Plan: CBC  Screening for prostate cancer - Plan: PSA  Urinary frequency  - Plan: PSA, POCT urinalysis dipstick  Multiple sclerosis  Spinal stenosis of lumbar region  MS (multiple sclerosis)  Lumbar radiculopathy  Right foot drop  Will arrange self driven chair and replacement ankle brace Routine  labs  He has meds and may call for refills  I have completed the patient encounter in its entirety as documented byFNP Leone Payor, with editing by me where necessary. Robert P.  Merla Riches, M.D.

## 2012-12-19 ENCOUNTER — Other Ambulatory Visit: Payer: Self-pay | Admitting: Internal Medicine

## 2012-12-23 ENCOUNTER — Telehealth: Payer: Self-pay | Admitting: Diagnostic Neuroimaging

## 2012-12-23 NOTE — Telephone Encounter (Signed)
Patient called stating that his scheduled appointment with Dr. Marjory Lies on 01/22/13 is too far out, and he needs a sooner appointment so that he can start his new MS medication. Please call the patient.

## 2012-12-24 ENCOUNTER — Encounter: Payer: Self-pay | Admitting: Internal Medicine

## 2013-01-06 ENCOUNTER — Other Ambulatory Visit: Payer: PRIVATE HEALTH INSURANCE

## 2013-01-08 ENCOUNTER — Ambulatory Visit
Admission: RE | Admit: 2013-01-08 | Discharge: 2013-01-08 | Disposition: A | Discharge status: Home / Self Care | Payer: Medicare Other | Source: Ambulatory Visit | Attending: Diagnostic Neuroimaging | Admitting: Diagnostic Neuroimaging

## 2013-01-08 DIAGNOSIS — G35 Multiple sclerosis: Secondary | ICD-10-CM

## 2013-01-08 MED ORDER — GADOBENATE DIMEGLUMINE 529 MG/ML IV SOLN
20.0000 mL | Freq: Once | INTRAVENOUS | Status: AC | PRN
  Administered 2013-01-08: 20 mL via INTRAVENOUS

## 2013-01-13 NOTE — Telephone Encounter (Signed)
Dr [name], patient called for a rx for a wheeled walker

## 2013-01-14 NOTE — Telephone Encounter (Signed)
Pt's wife came by asking about her husband's request for a walker. Pt's wife states she sent a message through [facility] but i dont see this message. Can we order him a rolling walker? Dr Doolittle's pt.

## 2013-01-14 NOTE — Telephone Encounter (Signed)
It is fine for the patient to get a rolling walker - Dx are instaibility with multiple falls and MS

## 2013-01-15 MED ORDER — ROLLER WALKER MISC: Status: DC

## 2013-01-15 NOTE — Telephone Encounter (Signed)
Pt notified and rx is at front desk for pickup

## 2013-01-21 ENCOUNTER — Encounter: Payer: Self-pay | Admitting: Internal Medicine

## 2013-01-22 ENCOUNTER — Encounter: Payer: Self-pay | Admitting: Diagnostic Neuroimaging

## 2013-01-22 ENCOUNTER — Ambulatory Visit (INDEPENDENT_AMBULATORY_CARE_PROVIDER_SITE_OTHER): Payer: Medicare Other | Admitting: Diagnostic Neuroimaging

## 2013-01-22 VITALS — BP 104/66 | HR 86 | Temp 98.0°F | Ht 68.0 in | Wt 210.0 lb

## 2013-01-22 DIAGNOSIS — G35 Multiple sclerosis: Secondary | ICD-10-CM

## 2013-01-22 MED ORDER — DALFAMPRIDINE ER 10 MG PO TB12: 10.0000 mg | ORAL_TABLET | Freq: Two times a day (BID) | ORAL | Status: DC
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
## 2013-01-23 ENCOUNTER — Other Ambulatory Visit: Payer: Self-pay | Admitting: Physician Assistant

## 2013-01-23 DIAGNOSIS — M21371 Foot drop, right foot: Secondary | ICD-10-CM

## 2013-02-03 ENCOUNTER — Telehealth: Payer: Self-pay | Admitting: Diagnostic Neuroimaging

## 2013-02-03 NOTE — Telephone Encounter (Signed)
I called back.  Spoke to [employer].  Provided clinical info she requested.  She said they will mail a response to the patient in about 5 business days.  They will call back if anything further is needed.

## 2013-02-03 NOTE — Telephone Encounter (Signed)
Seth Bake called responding to an appeal for Ampyra for the patient.  She is trying to get this medication approved and will need clinical information to do this.  Please call her direct line: [phone].

## 2013-02-03 NOTE — Telephone Encounter (Signed)
Message sent to [facility] for prior authorization.

## 2013-02-10 ENCOUNTER — Other Ambulatory Visit: Payer: Self-pay | Admitting: Internal Medicine

## 2013-02-10 ENCOUNTER — Telehealth: Payer: Self-pay

## 2013-02-10 NOTE — Telephone Encounter (Signed)
Patient needs a NEW rx for his patches please call for details at [phone]

## 2013-02-10 NOTE — Telephone Encounter (Signed)
Patient

## 2013-02-10 NOTE — Telephone Encounter (Signed)
Lidocaine (Lidoderm) patches

## 2013-02-10 NOTE — Telephone Encounter (Signed)
Authorized/sent.

## 2013-02-12 DIAGNOSIS — R269 Unspecified abnormalities of gait and mobility: Secondary | ICD-10-CM | POA: Diagnosis not present

## 2013-02-12 DIAGNOSIS — M216X9 Other acquired deformities of unspecified foot: Secondary | ICD-10-CM | POA: Diagnosis not present

## 2013-02-12 NOTE — Telephone Encounter (Signed)
lmom that RF was sent in.

## 2013-02-14 ENCOUNTER — Telehealth: Payer: Self-pay

## 2013-02-14 NOTE — Telephone Encounter (Signed)
PA needed for lidocaine patches. Completed form and faxed to [facility].

## 2013-02-19 NOTE — Telephone Encounter (Signed)
PA denied d/t not being covered for any of the pt's Dxs. Put denial letter in Dr Doolittle's box for review.

## 2013-02-26 NOTE — Telephone Encounter (Signed)
Left papers--I see no way around this

## 2013-02-27 NOTE — Telephone Encounter (Signed)
Notified pt and he may pay OOP if not cost prohibitive. I advised to check prices at different pharm to compare.

## 2013-03-28 ENCOUNTER — Other Ambulatory Visit: Payer: Self-pay | Admitting: Physician Assistant

## 2013-03-31 DIAGNOSIS — G988 Other disorders of nervous system: Secondary | ICD-10-CM | POA: Diagnosis not present

## 2013-03-31 DIAGNOSIS — G5610 Other lesions of median nerve, unspecified upper limb: Secondary | ICD-10-CM | POA: Diagnosis not present

## 2013-03-31 DIAGNOSIS — G609 Hereditary and idiopathic neuropathy, unspecified: Secondary | ICD-10-CM | POA: Diagnosis not present

## 2013-03-31 DIAGNOSIS — M216X9 Other acquired deformities of unspecified foot: Secondary | ICD-10-CM | POA: Diagnosis not present

## 2013-03-31 DIAGNOSIS — G5600 Carpal tunnel syndrome, unspecified upper limb: Secondary | ICD-10-CM | POA: Diagnosis not present

## 2013-03-31 DIAGNOSIS — IMO0002 Reserved for concepts with insufficient information to code with codable children: Secondary | ICD-10-CM | POA: Diagnosis not present

## 2013-05-12 ENCOUNTER — Other Ambulatory Visit: Payer: Self-pay

## 2013-05-12 MED ORDER — DIMETHYL FUMARATE 240 MG PO CPDR: 240.0000 mg | DELAYED_RELEASE_CAPSULE | Freq: Two times a day (BID) | ORAL | Status: DC

## 2013-05-13 DIAGNOSIS — G20 Parkinson's disease: Secondary | ICD-10-CM | POA: Diagnosis not present

## 2013-05-14 ENCOUNTER — Encounter: Payer: Self-pay | Admitting: Internal Medicine

## 2013-05-15 DIAGNOSIS — L570 Actinic keratosis: Secondary | ICD-10-CM | POA: Diagnosis not present

## 2013-05-15 DIAGNOSIS — L918 Other hypertrophic disorders of the skin: Secondary | ICD-10-CM | POA: Diagnosis not present

## 2013-05-15 DIAGNOSIS — L908 Other atrophic disorders of skin: Secondary | ICD-10-CM | POA: Diagnosis not present

## 2013-05-19 DIAGNOSIS — H2510 Age-related nuclear cataract, unspecified eye: Secondary | ICD-10-CM | POA: Diagnosis not present

## 2013-05-23 ENCOUNTER — Telehealth: Payer: Self-pay | Admitting: Diagnostic Neuroimaging

## 2013-05-23 NOTE — Telephone Encounter (Signed)
The patient has been taking Tecfidera 240mg  since 2013.  I need to clarify what the question is.  I called back.  Entered the extention noted.  It was not valid.  I pressed option for doctor and spoke with Ebony.  She placed me on hold for several minutes then came back to the line and said TK was not available.  She said she reviewed the notes and sees that the patient has been on Tecfidera.  She will send an email to TK, and ask her to call us back if needed.

## 2013-05-23 NOTE — Telephone Encounter (Signed)
TK with [facility] @ [phone] x [phone], stated pt called and stated Dr Leta Baptist was switching medication from Dimethyl Fumarate 240 MG CPDR and wanted to verify.  Please call and advise

## 2013-06-13 DIAGNOSIS — R350 Frequency of micturition: Secondary | ICD-10-CM | POA: Diagnosis not present

## 2013-06-13 DIAGNOSIS — N319 Neuromuscular dysfunction of bladder, unspecified: Secondary | ICD-10-CM | POA: Diagnosis not present

## 2013-07-08 DIAGNOSIS — G20 Parkinson's disease: Secondary | ICD-10-CM | POA: Diagnosis not present

## 2013-07-11 ENCOUNTER — Telehealth: Payer: Self-pay | Admitting: Diagnostic Neuroimaging

## 2013-07-11 ENCOUNTER — Encounter: Payer: Self-pay | Admitting: Diagnostic Neuroimaging

## 2013-07-11 NOTE — Telephone Encounter (Signed)
FYI, previous note.

## 2013-07-11 NOTE — Telephone Encounter (Signed)
Spoke to patient regarding rescheduling 07/21/13 appt per Dr. Gladstone Lighter schedule, patient verbalized understanding. Printed and mailed letter with new appointment time.

## 2013-07-11 NOTE — Telephone Encounter (Signed)
Patient wanted to inform Dr. 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 If questions, please return call.

## 2013-07-14 NOTE — Telephone Encounter (Signed)
I received notes and reviewed. -VRP

## 2013-07-17 ENCOUNTER — Ambulatory Visit (INDEPENDENT_AMBULATORY_CARE_PROVIDER_SITE_OTHER): Payer: Medicare Other

## 2013-07-17 ENCOUNTER — Ambulatory Visit (INDEPENDENT_AMBULATORY_CARE_PROVIDER_SITE_OTHER): Payer: Medicare Other | Admitting: Emergency Medicine

## 2013-07-17 VITALS — BP 138/78 | HR 99 | Temp 98.2°F | Resp 18 | Ht 68.0 in | Wt 198.0 lb

## 2013-07-17 DIAGNOSIS — S41109A Unspecified open wound of unspecified upper arm, initial encounter: Secondary | ICD-10-CM | POA: Diagnosis not present

## 2013-07-17 DIAGNOSIS — S5000XA Contusion of unspecified elbow, initial encounter: Secondary | ICD-10-CM

## 2013-07-17 DIAGNOSIS — L255 Unspecified contact dermatitis due to plants, except food: Secondary | ICD-10-CM

## 2013-07-17 DIAGNOSIS — S41111A Laceration without foreign body of right upper arm, initial encounter: Secondary | ICD-10-CM

## 2013-07-17 DIAGNOSIS — S5001XA Contusion of right elbow, initial encounter: Secondary | ICD-10-CM

## 2013-07-17 MED ORDER — METHYLPREDNISOLONE ACETATE 80 MG/ML IJ SUSP
120.0000 mg | Freq: Once | INTRAMUSCULAR | Status: AC
  Administered 2013-07-17: 120 mg via INTRAMUSCULAR

## 2013-07-17 NOTE — Progress Notes (Signed)
[facility] [address], [place] [address] [phone]  Date:  07/17/2013   Name:  Reginald Edwards   DOB:  01-Jun-1945   MRN:  [id number]  PCP:  Leandrew Koyanagi, MD    Chief Complaint: Elbow Injury and Poison Ivy   History of Present Illness:  Reginald Edwards is a 68 y.o. very pleasant male patient who presents with the following:  Multiple complaints.  Has poison ivy.  Swelling and redness and itching predominantly on left arm While coming to the office and slipped in an elevator and fell, striking his right elbow.  Has pain in the elbow and a laceration.   Current on tetanus.  Denies other complaint or health concern today.   Patient Active Problem List   Diagnosis Date Noted  . Right foot drop 12/18/2012  . Multiple sclerosis 03/20/2012  . Spinal stenosis of lumbar region 03/20/2012  . Lumbar radiculopathy 03/20/2012  . BETremor 12/06/2011  . Chest discomfort 10/02/2011  . Exertional dyspnea 10/02/2011  . MS (multiple sclerosis) 10/02/2011  . Mood disorder 12/23/2010  . Hyperlipidemia 12/23/2010  . Essential hypertension, benign 12/23/2010  . Hearing loss 12/23/2010    Past Medical History  Diagnosis Date  . Hyperlipemia   . Essential hypertension   . Mood disorder   . Hearing loss   . Cancer   . Arthritis   . Neuromuscular disorder   . Depression   . Anxiety   . Multiple sclerosis   . DJD (degenerative joint disease) of lumbar spine     Past Surgical History  Procedure Laterality Date  . Eye surgery    . Tonsillectomy    . Shoulder surgery      History  Substance Use Topics  . Smoking status: Never Smoker   . Smokeless tobacco: Never Used  . Alcohol Use: No    Family History  Problem Relation Age of Onset  . Cancer Mother   . Cancer Father     lung - because of smoking  . Diabetes Brother     type 2    Allergies  Allergen Reactions  . Clorazepate     Makes him extremely groggy  . Lisinopril     REACTION:  constant cough,irritation  . Vicodin [Hydrocodone-Acetaminophen]     Vomiting, drowsy    Medication list has been reviewed and updated.  Current Outpatient Prescriptions on File Prior to Visit  Medication Sig Dispense Refill  . ALPRAZolam (XANAX) 0.25 MG tablet Take 0.25 mg by mouth 3 (three) times daily.      Marland Kitchen amphetamine-dextroamphetamine (ADDERALL) 20 MG tablet Take 20 mg by mouth 3 (three) times daily.        Marland Kitchen aspirin 325 MG EC tablet Take 325 mg by mouth daily.      Marland Kitchen atorvastatin (LIPITOR) 20 MG tablet Take 20 mg by mouth daily.      Marland Kitchen atorvastatin (LIPITOR) 20 MG tablet Take 1 tablet by mouth  every day  90 tablet  0  . atorvastatin (LIPITOR) 20 MG tablet Take 1 tablet by mouth  every day  90 tablet  0  . buPROPion (WELLBUTRIN SR) 150 MG 12 hr tablet Take 150 mg by mouth 3 (three) times daily.      . caffeine 200 MG TABS Take 200 mg by mouth 2 (two) times daily. With breakfast and lunch      . Cholecalciferol 2000 UNITS CAPS Take 2,000 Units by mouth daily.      Marland Kitchen  cyclobenzaprine (FLEXERIL) 10 MG tablet Take 1 tablet by mouth 3  times a day as needed for  muscle spasms.  270 tablet  1  . lidocaine (LIDODERM) 5 % Place 1 patch onto the skin daily. Remove &amp; Discard  patch within 12 hours or as directed by MD  60 patch  1  . losartan-hydrochlorothiazide (HYZAAR) 50-12.5 MG per tablet Take 1 tablet by mouth  daily  90 tablet  0  . Misc. Devices (ROLLER WALKER) MISC USE AS DIRECTED. DX: 340, V15.88  1 each  0  . Multiple Vitamin (MULTIVITAMIN) tablet Take 1 tablet by mouth daily.      . temazepam (RESTORIL) 30 MG capsule Take 30 mg by mouth at bedtime as needed.       . vitamin C (ASCORBIC ACID) 500 MG tablet Take 500 mg by mouth daily.        Marland Kitchen dalfampridine 10 MG TB12 Take 1 tablet (10 mg total) by mouth 2 (two) times daily.  60 tablet  12  . desvenlafaxine (PRISTIQ) 100 MG 24 hr tablet Take 100 mg by mouth daily.      . Dimethyl Fumarate 240 MG CPDR Take 1 capsule (240 mg total)  by mouth 2 (two) times daily.  60 capsule  2  . silodosin (RAPAFLO) 8 MG CAPS capsule Take 8 mg by mouth daily with breakfast.       No current facility-administered medications on file prior to visit.    Review of Systems:  As per HPI, otherwise negative.    Physical Examination: Filed Vitals:   07/17/13 1305  BP: 138/78  Pulse: 99  Temp: 98.2 F (36.8 C)  Resp: 18   Filed Vitals:   07/17/13 1305  Height: 5\' 8"  (1.727 m)  Weight: 198 lb (89.812 kg)   Body mass index is 30.11 kg/(m^2). Ideal Body Weight: Weight in (lb) to have BMI = 25: 164.1   GEN: WDWN, NAD, Non-toxic, Alert & Oriented x 3 HEENT: Atraumatic, Normocephalic.  Ears and Nose: No external deformity. EXTR: No clubbing/cyanosis/edema NEURO: Normal gait.  PSYCH: Normally interactive. Conversant. Not depressed or anxious appearing.  Calm demeanor.  SKIN:  Rhus derematitis RIGHT elbow:  2 cm laceration.  Tender and guards minimally  Assessment and Plan: Poison ivy Depo medrol  Laceration elbow  Contusion elbow  Signed,  Ellison Carwin, MD   [facility] reading (PRIMARY) by  Dr. Ouida Sills.  negative.

## 2013-07-17 NOTE — Patient Instructions (Signed)

## 2013-07-17 NOTE — Progress Notes (Signed)
PROCEDURE: Verbal consent obtained from patient.  Local anesthesia with 2cc Lidocaine 2% with epinephrine.  Wound scrubbed with soap and water and rinsed.  Wound closed with #4 4-0 Prolene simple interrupted sutures.  Wound cleansed and dressed.

## 2013-07-18 ENCOUNTER — Other Ambulatory Visit: Payer: Self-pay | Admitting: Physician Assistant

## 2013-07-18 MED ORDER — LOSARTAN POTASSIUM-HCTZ 50-12.5 MG PO TABS: ORAL_TABLET | ORAL | Status: DC

## 2013-07-18 MED ORDER — ATORVASTATIN CALCIUM 20 MG PO TABS: ORAL_TABLET | ORAL | Status: DC

## 2013-07-21 ENCOUNTER — Ambulatory Visit: Payer: Medicare Other | Admitting: Diagnostic Neuroimaging

## 2013-08-05 ENCOUNTER — Telehealth: Payer: Self-pay

## 2013-08-05 NOTE — Telephone Encounter (Signed)
Got a notice from [employer] that lidocaine patches need a PA. We have already been through this process this year and coverage was denied d/t patches not being covered for any of pt's Dxs. Nothing further we can do.

## 2013-08-08 ENCOUNTER — Ambulatory Visit (INDEPENDENT_AMBULATORY_CARE_PROVIDER_SITE_OTHER): Payer: Medicare Other | Admitting: Diagnostic Neuroimaging

## 2013-08-08 ENCOUNTER — Encounter: Payer: Self-pay | Admitting: Diagnostic Neuroimaging

## 2013-08-08 VITALS — BP 122/76 | HR 102 | Temp 98.4°F | Ht 68.0 in | Wt 201.4 lb

## 2013-08-08 DIAGNOSIS — M48061 Spinal stenosis, lumbar region without neurogenic claudication: Secondary | ICD-10-CM | POA: Diagnosis not present

## 2013-08-08 DIAGNOSIS — G252 Other specified forms of tremor: Secondary | ICD-10-CM | POA: Diagnosis not present

## 2013-08-08 DIAGNOSIS — IMO0002 Reserved for concepts with insufficient information to code with codable children: Secondary | ICD-10-CM | POA: Diagnosis not present

## 2013-08-08 DIAGNOSIS — G250 Essential tremor: Secondary | ICD-10-CM

## 2013-08-08 DIAGNOSIS — I6789 Other cerebrovascular disease: Secondary | ICD-10-CM

## 2013-08-08 DIAGNOSIS — F329 Major depressive disorder, single episode, unspecified: Secondary | ICD-10-CM

## 2013-08-08 DIAGNOSIS — F32A Depression, unspecified: Secondary | ICD-10-CM

## 2013-08-08 DIAGNOSIS — F3289 Other specified depressive episodes: Secondary | ICD-10-CM

## 2013-08-08 DIAGNOSIS — M5412 Radiculopathy, cervical region: Secondary | ICD-10-CM

## 2013-08-08 DIAGNOSIS — M5010 Cervical disc disorder with radiculopathy, unspecified cervical region: Secondary | ICD-10-CM | POA: Insufficient documentation

## 2013-08-08 DIAGNOSIS — M5416 Radiculopathy, lumbar region: Secondary | ICD-10-CM

## 2013-08-08 DIAGNOSIS — I679 Cerebrovascular disease, unspecified: Secondary | ICD-10-CM
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
## 2013-08-21 ENCOUNTER — Encounter: Payer: Self-pay | Admitting: Internal Medicine

## 2013-08-21 DIAGNOSIS — E785 Hyperlipidemia, unspecified: Secondary | ICD-10-CM | POA: Diagnosis not present

## 2013-08-21 DIAGNOSIS — I10 Essential (primary) hypertension: Secondary | ICD-10-CM | POA: Diagnosis not present

## 2013-09-01 ENCOUNTER — Encounter: Payer: Self-pay | Admitting: Internal Medicine

## 2013-09-04 ENCOUNTER — Ambulatory Visit: Payer: Medicare Other | Admitting: Diagnostic Neuroimaging

## 2013-09-07 ENCOUNTER — Other Ambulatory Visit: Payer: Self-pay | Admitting: Physician Assistant

## 2013-09-09 MED ORDER — ATORVASTATIN CALCIUM 20 MG PO TABS: ORAL_TABLET | ORAL | Status: DC

## 2013-09-09 MED ORDER — LOSARTAN POTASSIUM-HCTZ 50-12.5 MG PO TABS: ORAL_TABLET | ORAL | Status: DC

## 2013-09-12 ENCOUNTER — Other Ambulatory Visit: Payer: Self-pay

## 2013-09-12 MED ORDER — ATORVASTATIN CALCIUM 20 MG PO TABS: ORAL_TABLET | ORAL | Status: DC

## 2013-09-12 MED ORDER — LOSARTAN POTASSIUM-HCTZ 50-12.5 MG PO TABS: ORAL_TABLET | ORAL | Status: DC

## 2013-10-01 ENCOUNTER — Encounter: Payer: Self-pay | Admitting: Internal Medicine

## 2013-10-01 ENCOUNTER — Ambulatory Visit (INDEPENDENT_AMBULATORY_CARE_PROVIDER_SITE_OTHER): Payer: Medicare Other | Admitting: Internal Medicine

## 2013-10-01 VITALS — BP 104/76 | HR 102 | Temp 98.4°F | Resp 16 | Ht 68.75 in | Wt 207.4 lb

## 2013-10-01 DIAGNOSIS — G35D Multiple sclerosis, unspecified: Secondary | ICD-10-CM | POA: Diagnosis not present

## 2013-10-01 DIAGNOSIS — I10 Essential (primary) hypertension: Secondary | ICD-10-CM | POA: Diagnosis not present

## 2013-10-01 DIAGNOSIS — G35 Multiple sclerosis: Secondary | ICD-10-CM | POA: Diagnosis not present

## 2013-10-01 DIAGNOSIS — E785 Hyperlipidemia, unspecified: Secondary | ICD-10-CM

## 2013-10-01 DIAGNOSIS — G252 Other specified forms of tremor: Secondary | ICD-10-CM | POA: Diagnosis not present

## 2013-10-01 DIAGNOSIS — Z23 Encounter for immunization: Secondary | ICD-10-CM | POA: Diagnosis not present

## 2013-10-01 DIAGNOSIS — G250 Essential tremor: Secondary | ICD-10-CM | POA: Diagnosis not present

## 2013-10-01 DIAGNOSIS — Z0000 Encounter for general adult medical examination without abnormal findings: Secondary | ICD-10-CM | POA: Diagnosis not present

## 2013-10-01 MED ORDER — LOSARTAN POTASSIUM-HCTZ 50-12.5 MG PO TABS: ORAL_TABLET | ORAL | Status: DC

## 2013-10-01 MED ORDER — ATORVASTATIN CALCIUM 20 MG PO TABS: ORAL_TABLET | ORAL | Status: DC

## 2013-10-01 MED ORDER — LIDOCAINE 5 % EX PTCH: MEDICATED_PATCH | CUTANEOUS | Status: DC

## 2013-10-01 NOTE — Progress Notes (Addendum)
Subjective:    Patient ID: Reginald Edwards, male    DOB: 1945/06/29, 68 y.o.   MRN: [id number]  HPIhere for annual medicare wellness ov  Patient Active Problem List   Diagnosis Date Noted  . Postural tremor 08/08/2013  . 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 Cervical disc disorder with radiculopathy of cervical region 08/08/2013  . 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 Depression 08/08/2013  . Right foot drop 12/18/2012  . Spinal stenosis of lumbar region 03/20/2012  . Lumbar radiculopathy 03/20/2012  . BETremor 12/06/2011  . Chest discomfort 10/02/2011  . Exertional dyspnea 10/02/2011  . Mood disorder 12/23/2010  . Hyperlipidemia 12/23/2010  . Essential hypertension, benign 12/23/2010  . Hearing loss 12/23/2010  see neuro eval at [facility]--we've come full circle back to tremor that may resp to propran--not MS ASSESSMENT: 68 y.o. male with h/o HTN, severe DJD with lumbar radiculopathy and foot drop, who presents for second opinion regarding many years of slowly progressive impaired gait, falls, and cognitive dysfunction, there is no clear history of acute neurologic events, neurologic exam does not show any signs of myelopathy, corticospinal tract dysfunction and shows chronic right foot drop due to severe lumbar radiculopathy, and impaired gait, and slowed RAMs. 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 Etat Crible has been described in the literature and has been associated at times with parkinsonism. Other etiologies would include CADASIL or causes of premature microvascular disease, this does not look like a leukodystrophy by imaging or clinical presentation. The lack of family history and lack of anterior temporal lobe changes on MRI make this somewhat less likely. There is no evidence for  demyelinating disease" Dr Linus Mako  LS spine probs being disc w/ Dr Hal Neer most recently He has a 20 year history of right foot drop and radiculopathy secondary to severe degenerative disease of his lumbosacral spine. He has been evaluated by different spine specialists and the conclusion is that surgery is not indicated. He wears a right foot brace  -He reports fatigue, some short-term memory problems, word finding problems, mood disturbances for the last 10 years. As detailed in past records-started after injury at work. -He carries a diagnosis of anxiety, depression, ADHD. He also reports a 3 month history of visual hallucinations in 2013, unclear if that were psychiatric or medication induced as they resolved.   He is taking Adderall, Xanax, Cymbalta and wellbutrin(which he feels is best med). His psychiatrist is Dr.Kaur.  -Action/postural tremor for more than 10 years- shaking contributes/ his handwriting getting worse. He has no problem in feeding himself. He does not drink and has no family history of tremors  -Gait imbalance and falls are his most disabling symptoms. He falls 2-3 times a month. He walks with a walker and has had PT evaluation. Fully eval by neuro.  Lives w/ wife-RN colonos recent  Review of Systems Remainder neg of 13 point review    Objective:   Physical Exam BP 104/76  Pulse 102  Temp(Src) 98.4 F (36.9 C) (Oral)  Resp 16  Ht 5' 8.75" (1.746 m)  Wt 207 lb 6.4 oz (94.076 kg)  BMI 30.86 kg/m2  SpO2 96% Mood good/thought content nl       Assessment & Plan:  Need for prophylactic vaccination against Streptococcus pneumoniae (pneumococcus) - Plan: Pneumococcal conjugate vaccine 13-valent IM  Need for prophylactic vaccination  and inoculation against influenza - Plan: Flu Vaccine QUAD 36+ mos IM  MS (multiple sclerosis) dx erroneous---see note  Essential hypertension, benign  Hyperlipidemia  BMI 30  F/u pe and labs in dec '15  Meds ordered this  encounter  Medications  . atorvastatin (LIPITOR) 20 MG tablet    Sig: Take 1 tablet by mouth  every day    Dispense:  90 tablet    Refill:  3  . lidocaine (LIDODERM) 5 %    Sig: Place 1 patch onto the skin daily. Remove &amp; Discard  patch within 12 hours or as directed by MD    Dispense:  60 patch    Refill:  3  . losartan-hydrochlorothiazide (HYZAAR) 50-12.5 MG per tablet    Sig: Take 1 tablet by mouth  Daily    Dispense:  90 tablet    Refill:  3    10/02/13 Sent in rx for inderal la 80 and asked him to cut losarHCT in half and follow BPs.

## 2013-10-02 ENCOUNTER — Telehealth: Payer: Self-pay | Admitting: Radiology

## 2013-10-02 DIAGNOSIS — Z85828 Personal history of other malignant neoplasm of skin: Secondary | ICD-10-CM | POA: Insufficient documentation

## 2013-10-02 MED ORDER — PROPRANOLOL HCL ER 80 MG PO CP24: 80.0000 mg | ORAL_CAPSULE | Freq: Every day | ORAL | Status: DC

## 2013-10-02 NOTE — Addendum Note (Signed)
Addended by: Leandrew Koyanagi on: 10/02/2013 09:00 AM   Modules accepted: Orders, Medications

## 2013-10-02 NOTE — Telephone Encounter (Signed)
My chart message sent to patient regarding new Rx

## 2013-10-04 ENCOUNTER — Encounter: Payer: Self-pay | Admitting: Internal Medicine

## 2013-10-10 ENCOUNTER — Telehealth: Payer: Self-pay

## 2013-10-10 NOTE — Telephone Encounter (Signed)
Received another denial from [facility] (which is also pt's pharm) for lidocaine patches - not covered for pt's Dxs I don't see documentation that a PA was attempted again on this med since last denial?

## 2013-10-20 ENCOUNTER — Ambulatory Visit (AMBULATORY_SURGERY_CENTER): Payer: Self-pay | Admitting: *Deleted

## 2013-10-20 VITALS — Ht 68.0 in | Wt 213.0 lb

## 2013-10-20 DIAGNOSIS — Z86010 Personal history of colonic polyps: Secondary | ICD-10-CM

## 2013-10-20 MED ORDER — MOVIPREP 100 G PO SOLR: ORAL | Status: DC

## 2013-10-20 NOTE — Progress Notes (Signed)
Patient denies any allergies to eggs or soy. Patient denies any problems with anesthesia/sedation. Patient denies any oxygen use at home and does not take any diet/weight loss medications. EMMI education assisgned to patient on colonoscopy, this was explained and instructions given to patient. 

## 2013-10-21 ENCOUNTER — Encounter: Payer: Self-pay | Admitting: Internal Medicine

## 2013-10-22 ENCOUNTER — Other Ambulatory Visit: Payer: Self-pay | Admitting: Internal Medicine

## 2013-10-24 ENCOUNTER — Encounter: Payer: Self-pay | Admitting: Internal Medicine

## 2013-10-24 ENCOUNTER — Telehealth: Payer: Self-pay | Admitting: Internal Medicine

## 2013-10-24 MED ORDER — LOSARTAN POTASSIUM-HCTZ 50-12.5 MG PO TABS: ORAL_TABLET | ORAL | Status: DC

## 2013-10-24 MED ORDER — ATORVASTATIN CALCIUM 20 MG PO TABS: ORAL_TABLET | ORAL | Status: DC

## 2013-10-24 NOTE — Telephone Encounter (Signed)
Coupon for free MoviPrep called in to [place].  Pt's wife aware

## 2013-11-03 ENCOUNTER — Ambulatory Visit (AMBULATORY_SURGERY_CENTER): Payer: Medicare Other | Admitting: Internal Medicine

## 2013-11-03 ENCOUNTER — Encounter: Payer: Self-pay | Admitting: Internal Medicine

## 2013-11-03 VITALS — BP 122/74 | HR 45 | Temp 97.3°F | Resp 15 | Ht 68.0 in | Wt 213.0 lb

## 2013-11-03 DIAGNOSIS — E669 Obesity, unspecified: Secondary | ICD-10-CM | POA: Diagnosis not present

## 2013-11-03 DIAGNOSIS — I10 Essential (primary) hypertension: Secondary | ICD-10-CM | POA: Diagnosis not present

## 2013-11-03 DIAGNOSIS — D124 Benign neoplasm of descending colon: Secondary | ICD-10-CM | POA: Diagnosis not present

## 2013-11-03 DIAGNOSIS — Z86010 Personal history of colonic polyps: Secondary | ICD-10-CM | POA: Diagnosis not present

## 2013-11-03 MED ORDER — SODIUM CHLORIDE 0.9 % IV SOLN: 500.0000 mL | INTRAVENOUS | Status: DC

## 2013-11-03 MED ORDER — FLEET ENEMA 7-19 GM/118ML RE ENEM
2.0000 | ENEMA | Freq: Once | RECTAL | Status: AC
  Administered 2013-11-03: 2 via RECTAL

## 2013-11-03 NOTE — Patient Instructions (Signed)
Discharge instructions given with verbal understanding. Handouts on polyps and diverticulosis. Resume previous medications. YOU HAD AN ENDOSCOPIC PROCEDURE TODAY AT THE [facility]: Refer to the procedure report that was given to you for any specific questions about what was found during the examination.  If the procedure report does not answer your questions, please call your gastroenterologist to clarify.  If you requested that your care partner not be given the details of your procedure findings, then the procedure report has been included in a sealed envelope for you to review at your convenience later.  YOU SHOULD EXPECT: Some feelings of bloating in the abdomen. Passage of more gas than usual.  Walking can help get rid of the air that was put into your GI tract during the procedure and reduce the bloating. If you had a lower endoscopy (such as a colonoscopy or flexible sigmoidoscopy) you may notice spotting of blood in your stool or on the toilet paper. If you underwent a bowel prep for your procedure, then you may not have a normal bowel movement for a few days.  DIET: Your first meal following the procedure should be a light meal and then it is ok to progress to your normal diet.  A half-sandwich or bowl of soup is an example of a good first meal.  Heavy or fried foods are harder to digest and may make you feel nauseous or bloated.  Likewise meals heavy in dairy and vegetables can cause extra gas to form and this can also increase the bloating.  Drink plenty of fluids but you should avoid alcoholic beverages for 24 hours.  ACTIVITY: Your care partner should take you home directly after the procedure.  You should plan to take it easy, moving slowly for the rest of the day.  You can resume normal activity the day after the procedure however you should NOT DRIVE or use heavy machinery for 24 hours (because of the sedation medicines used during the test).    SYMPTOMS TO REPORT  IMMEDIATELY: A gastroenterologist can be reached at any hour.  During normal business hours, 8:30 AM to 5:00 PM Monday through Friday, call [phone].  After hours and on weekends, please call the GI answering service at [phone] who will take a message and have the physician on call contact you.   Following lower endoscopy (colonoscopy or flexible sigmoidoscopy):  Excessive amounts of blood in the stool  Significant tenderness or worsening of abdominal pains  Swelling of the abdomen that is new, acute  Fever of 100F or higher  FOLLOW UP: If any biopsies were taken you will be contacted by phone or by letter within the next 1-3 weeks.  Call your gastroenterologist if you have not heard about the biopsies in 3 weeks.  Our staff will call the home number listed on your records the next business day following your procedure to check on you and address any questions or concerns that you may have at that time regarding the information given to you following your procedure. This is a courtesy call and so if there is no answer at the home number and we have not heard from you through the emergency physician on call, we will assume that you have returned to your regular daily activities without incident.  SIGNATURES/CONFIDENTIALITY: You and/or your care partner have signed paperwork which will be entered into your electronic medical record.  These signatures attest to the fact that that the information above on your After Visit Summary   has been reviewed and is understood.  Full responsibility of the confidentiality of this discharge information lies with you and/or your care-partner. 

## 2013-11-03 NOTE — Progress Notes (Signed)
Sundra Aland, RN reported to Dr. Henrene Pastor that pt completed movi prep but did not drink the last 16 oz of water.  His last result was brown liquid.  Vocal order per Dr. Lytle Michaels till adaquate for colonoscopy. maw

## 2013-11-03 NOTE — Op Note (Signed)
[place]  [address] [place], [address]   COLONOSCOPY PROCEDURE REPORT  PATIENT: Reginald Edwards, Reginald Edwards  MR#: [id number] BIRTHDATE: 20-Apr-1945 , 68  yrs. old GENDER: male ENDOSCOPIST: Eustace Quail, MD REFERRED VQ:[id number] Program Recall PROCEDURE DATE:  11/03/2013 PROCEDURE:   Colonoscopy with snare polypectomy x 1 First Screening Colonoscopy - Avg.  risk and is 50 yrs.  old or older - No.  Prior Negative Screening - Now for repeat screening. N/A  History of Adenoma - Now for follow-up colonoscopy & has been > or = to 3 yrs.  Yes hx of adenoma.  Has been 3 or more years since last colonoscopy.  Polyps Removed Today? Yes. ASA CLASS:   Class II INDICATIONS:surveillance colonoscopy based on a history of adenomatous colonic polyp(s);  index exam probably 2007 with small polyps. Last exam October 2010 with small tubular adenoma. MEDICATIONS: Monitored anesthesia care and Propofol 300 mg IV  DESCRIPTION OF PROCEDURE:   After the risks benefits and alternatives of the procedure were thoroughly explained, informed consent was obtained.  The digital rectal exam revealed no abnormalities of the rectum.   The LB [id number]  endoscope was introduced through the anus and advanced to the cecum, which was identified by both the appendix and ileocecal valve. No adverse events experienced.   The quality of the prep was good, using MoviPrep. Also required preprocedure enemas.  The instrument was then slowly withdrawn as the colon was fully examined.    COLON FINDINGS: A single polyp measuring 3 mm in size was found in the descending colon.  A polypectomy was performed with a cold snare.  The resection was complete, the polyp tissue was completely retrieved and sent to histology.   There was mild diverticulosis noted in the sigmoid colon.   The examination was otherwise normal. Retroflexed views revealed no abnormalities. The time to cecum=4 minutes 42 seconds.   Withdrawal time=16 minutes 02 seconds.  The scope was withdrawn and the procedure completed. COMPLICATIONS: There were no immediate complications.  ENDOSCOPIC IMPRESSION: 1.   Single polyp measuring 3 mm in size was found in the descending colon; polypectomy was performed with a cold snare 2.   Mild diverticulosis was noted in the sigmoid colon 3.   The examination was otherwise normal  RECOMMENDATIONS: 1. Repeat colonoscopy in 5 years if polyp adenomatous; otherwise 10 years  eSigned:  Eustace Quail, MD 11/03/2013 11:45 AM   cc: Tami Lin, MD and The Patient

## 2013-11-03 NOTE — Progress Notes (Signed)
Procedure ends, to recovery, report given and VSS. 

## 2013-11-03 NOTE — Progress Notes (Signed)
Called to room to assist during endoscopic procedure.  Patient ID and intended procedure confirmed with present staff. Received instructions for my participation in the procedure from the performing physician.  

## 2013-11-04 ENCOUNTER — Telehealth: Payer: Self-pay | Admitting: *Deleted

## 2013-11-04 NOTE — Telephone Encounter (Signed)
No answer, left message to call if questions or concerns., 

## 2013-11-06 ENCOUNTER — Encounter: Payer: Self-pay | Admitting: Internal Medicine

## 2013-12-24 ENCOUNTER — Encounter: Payer: Self-pay | Admitting: Internal Medicine

## 2013-12-24 ENCOUNTER — Ambulatory Visit (INDEPENDENT_AMBULATORY_CARE_PROVIDER_SITE_OTHER): Payer: Medicare Other | Admitting: Internal Medicine

## 2013-12-24 ENCOUNTER — Other Ambulatory Visit: Payer: Self-pay | Admitting: Internal Medicine

## 2013-12-24 VITALS — BP 127/88 | HR 68 | Temp 98.6°F | Resp 16 | Ht 69.5 in | Wt 213.0 lb

## 2013-12-24 DIAGNOSIS — E785 Hyperlipidemia, unspecified: Secondary | ICD-10-CM | POA: Diagnosis not present

## 2013-12-24 DIAGNOSIS — M21371 Foot drop, right foot: Secondary | ICD-10-CM

## 2013-12-24 DIAGNOSIS — I10 Essential (primary) hypertension: Secondary | ICD-10-CM | POA: Diagnosis not present

## 2013-12-24 DIAGNOSIS — I679 Cerebrovascular disease, unspecified: Secondary | ICD-10-CM

## 2013-12-24 DIAGNOSIS — F329 Major depressive disorder, single episode, unspecified: Secondary | ICD-10-CM

## 2013-12-24 DIAGNOSIS — M2041 Other hammer toe(s) (acquired), right foot: Secondary | ICD-10-CM

## 2013-12-24 DIAGNOSIS — M4806 Spinal stenosis, lumbar region: Secondary | ICD-10-CM

## 2013-12-24 DIAGNOSIS — F39 Unspecified mood [affective] disorder: Secondary | ICD-10-CM

## 2013-12-24 DIAGNOSIS — Z0000 Encounter for general adult medical examination without abnormal findings: Secondary | ICD-10-CM | POA: Diagnosis not present

## 2013-12-24 DIAGNOSIS — N521 Erectile dysfunction due to diseases classified elsewhere: Secondary | ICD-10-CM | POA: Diagnosis not present

## 2013-12-24 DIAGNOSIS — Z125 Encounter for screening for malignant neoplasm of prostate: Secondary | ICD-10-CM | POA: Diagnosis not present

## 2013-12-24 DIAGNOSIS — I6789 Other cerebrovascular disease: Secondary | ICD-10-CM

## 2013-12-24 DIAGNOSIS — M48061 Spinal stenosis, lumbar region without neurogenic claudication: Secondary | ICD-10-CM

## 2013-12-24 DIAGNOSIS — F32A Depression, unspecified: Secondary | ICD-10-CM

## 2013-12-24 DIAGNOSIS — R739 Hyperglycemia, unspecified: Secondary | ICD-10-CM | POA: Diagnosis not present

## 2013-12-24 LAB — POCT URINALYSIS DIPSTICK
Bilirubin, UA: NEGATIVE
Blood, UA: NEGATIVE
GLUCOSE UA: NEGATIVE
Ketones, UA: NEGATIVE
LEUKOCYTES UA: NEGATIVE
NITRITE UA: NEGATIVE
PROTEIN UA: NEGATIVE
SPEC GRAV UA: 1.025
Urobilinogen, UA: 0.2
pH, UA: 5.5

## 2013-12-24 LAB — COMPLETE METABOLIC PANEL WITH GFR
ALBUMIN: 4.1 g/dL (ref 3.5–5.2)
ALT: 26 U/L (ref 0–53)
AST: 29 U/L (ref 0–37)
Alkaline Phosphatase: 71 U/L (ref 39–117)
BUN: 24 mg/dL — ABNORMAL HIGH (ref 6–23)
CALCIUM: 9.7 mg/dL (ref 8.4–10.5)
CHLORIDE: 102 meq/L (ref 96–112)
CO2: 26 meq/L (ref 19–32)
Creat: 1.34 mg/dL (ref 0.50–1.35)
GFR, EST AFRICAN AMERICAN: 63 mL/min
GFR, Est Non African American: 55 mL/min — ABNORMAL LOW
Glucose, Bld: 130 mg/dL — ABNORMAL HIGH (ref 70–99)
POTASSIUM: 4 meq/L (ref 3.5–5.3)
SODIUM: 142 meq/L (ref 135–145)
Total Bilirubin: 0.4 mg/dL (ref 0.2–1.2)
Total Protein: 6.4 g/dL (ref 6.0–8.3)

## 2013-12-24 LAB — CBC WITH DIFFERENTIAL/PLATELET
Basophils Absolute: 0.1 10*3/uL (ref 0.0–0.1)
Basophils Relative: 1 % (ref 0–1)
Eosinophils Absolute: 0.5 10*3/uL (ref 0.0–0.7)
Eosinophils Relative: 5 % (ref 0–5)
HCT: 44.1 % (ref 39.0–52.0)
HEMOGLOBIN: 15.5 g/dL (ref 13.0–17.0)
LYMPHS ABS: 1.6 10*3/uL (ref 0.7–4.0)
LYMPHS PCT: 17 % (ref 12–46)
MCH: 30.3 pg (ref 26.0–34.0)
MCHC: 35.1 g/dL (ref 30.0–36.0)
MCV: 86.3 fL (ref 78.0–100.0)
MPV: 10 fL (ref 9.4–12.4)
Monocytes Absolute: 0.9 10*3/uL (ref 0.1–1.0)
Monocytes Relative: 10 % (ref 3–12)
Neutro Abs: 6.3 10*3/uL (ref 1.7–7.7)
Neutrophils Relative %: 67 % (ref 43–77)
Platelets: 217 10*3/uL (ref 150–400)
RBC: 5.11 MIL/uL (ref 4.22–5.81)
RDW: 13.9 % (ref 11.5–15.5)
WBC: 9.4 10*3/uL (ref 4.0–10.5)

## 2013-12-24 LAB — LIPID PANEL
Cholesterol: 137 mg/dL (ref 0–200)
HDL: 41 mg/dL (ref >39)
LDL CALC: 49 mg/dL (ref 0–99)
TRIGLYCERIDES: 237 mg/dL — AB (ref <150)
Total CHOL/HDL Ratio: 3.3 Ratio
VLDL: 47 mg/dL — AB (ref 0–40)

## 2013-12-24 MED ORDER — SILDENAFIL CITRATE 100 MG PO TABS: 50.0000 mg | ORAL_TABLET | Freq: Every day | ORAL | Status: DC | PRN

## 2013-12-24 NOTE — Progress Notes (Signed)
   Subjective:    Patient ID: Reginald Edwards, male    DOB: 05-Jan-1945, 68 y.o.   MRN: [id number]  HPI    Review of Systems  Constitutional: Positive for fatigue.  HENT: Positive for hearing loss and tinnitus.   Eyes: Negative.   Respiratory: Positive for shortness of breath.   Cardiovascular: Negative.   Gastrointestinal: Positive for nausea, diarrhea and constipation.  Endocrine: Positive for heat intolerance and polydipsia.  Genitourinary: Positive for urgency.  Musculoskeletal: Positive for myalgias, back pain, joint swelling, arthralgias, gait problem, neck pain and neck stiffness.  Skin: Positive for color change.  Allergic/Immunologic: Negative.   Neurological: Positive for dizziness, tremors, speech difficulty, weakness, light-headedness, numbness and headaches.  Hematological: Negative.   Psychiatric/Behavioral: Positive for confusion, sleep disturbance, dysphoric mood and decreased concentration. The patient is nervous/anxious.        Objective:   Physical Exam        Assessment & Plan:

## 2013-12-24 NOTE — Progress Notes (Signed)
Subjective:    Patient ID: Reginald Edwards, male    DOB: 1945-07-07, 68 y.o.   MRN: [id number]  HPICPE/medicare wellness Less and less active--hard making self work out--stressed by multiple illnesses At least MS now thought inaccurate dx but radicular/neuromuscular sxt not well defined and affecting activity leading to multiple falls Continues with psychiatry Patient Active Problem List   Diagnosis Date Noted  . Postural tremor 08/08/2013  . 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 Cervical disc disorder with radiculopathy of cervical region 08/08/2013  . 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 Depression 08/08/2013  . Right foot drop 12/18/2012  . Spinal stenosis of lumbar region 03/20/2012  . Lumbar radiculopathy 03/20/2012  . BETremor 12/06/2011  . Chest discomfort 10/02/2011  . Exertional dyspnea 10/02/2011  . Mood disorder 12/23/2010  . Hyperlipidemia 12/23/2010  . Essential hypertension, benign 12/23/2010  . Hearing loss 12/23/2010   Prior to Admission medications   Medication Sig Start Date End Date Taking? Authorizing Provider  ALPRAZolam (XANAX) 0.25 MG tablet Take 0.25 mg by mouth 3 (three) times daily.   Yes Historical Provider, MD  amphetamine-dextroamphetamine (ADDERALL) 20 MG tablet Take 20 mg by mouth 3 (three) times daily.     Yes Historical Provider, MD  aspirin 325 MG EC tablet Take 325 mg by mouth daily.   Yes Historical Provider, MD  atorvastatin (LIPITOR) 20 MG tablet Take 1 tablet by mouth  every day 10/24/13  Yes Reginald Koyanagi, MD  buPROPion [facility] SR) 200 MG 12 hr tablet Take 200 mg by mouth 3 (three) times daily.   Yes Historical Provider, MD  caffeine 200 MG TABS Take 200 mg by mouth 3 (three) times daily. With breakfast and lunch   Yes Historical Provider, MD  Cholecalciferol (VITAMIN D3) 2000 UNITS TABS Take by mouth daily.   Yes Historical Provider, MD  famotidine (PEPCID) 20 MG tablet Take 20 mg by mouth as needed for heartburn or indigestion.    Yes Historical Provider, MD  losartan-hydrochlorothiazide [facility]) 50-12.5 MG per tablet Take 1 tablet by mouth  Daily 10/24/13  Yes Reginald Koyanagi, MD  Misc. Devices (ROLLER WALKER) MISC USE AS DIRECTED. DX: 340, V15.88 01/15/13  Yes Reginald Bale, PA-C  Multiple Vitamin (MULTIVITAMIN) tablet Take 1 tablet by mouth daily.   Yes Historical Provider, MD  Naproxen (NAPROSYN PO) Take 1-2 tablets by mouth 2 (two) times daily as needed.   Yes Historical Provider, MD  propranolol ER (INDERAL LA) 80 MG 24 hr capsule Take 1 capsule by mouth  daily 10/23/13  Yes Reginald Koyanagi, MD  tamsulosin (FLOMAX) 0.4 MG CAPS capsule Take 0.4 mg by mouth.   Yes Historical Provider, MD  temazepam (RESTORIL) 30 MG capsule  10/21/13  Yes Historical Provider, MD  lidocaine (LIDODERM) 5 % Place 1 patch onto the skin daily. Remove &amp; Discard  patch within 12 hours or as directed by MD Patient not taking: Reported on 12/24/2013 10/01/13   Reginald Koyanagi, MD   Recent colonos Neuro 8/12 Dr Reginald Edwards--f/u Reginald Edwards imm utd he thinks includes prevnar Just had 11th grandchild  Review of Systems See ros as recorded by CNA No new changes Review of Systems  Constitutional: Positive for fatigue.  HENT: Positive for hearing loss and tinnitus.   Eyes: Negative.   Respiratory: Positive for shortness of breath.   Cardiovascular: Negative.   Gastrointestinal: Positive for nausea, diarrhea and constipation.  Endocrine: Positive for heat intolerance and polydipsia.  Genitourinary: Positive for urgency.  Musculoskeletal: Positive  for myalgias, back pain, joint swelling, arthralgias, gait problem, neck pain and neck stiffness.  Skin: Positive for color change.  Allergic/Immunologic: Negative.   Neurological: Positive for dizziness, tremors, speech difficulty, weakness, light-headedness, numbness and headaches.  Hematological: Negative.   Psychiatric/Behavioral: Positive for confusion, sleep disturbance, dysphoric mood  and decreased concentration. The patient is nervous/anxious.       Objective:   Physical Exam  Constitutional: He is oriented to person, place, and time. He appears well-developed and well-nourished.  Overweight   HENT:  Head: Normocephalic.  Right Ear: External ear normal.  Left Ear: External ear normal.  Nose: Nose normal.  Mouth/Throat: Oropharynx is clear and moist.  Tms and canals clear  Eyes: Conjunctivae and EOM are normal. Pupils are equal, round, and reactive to light.  Neck: Normal range of motion. Neck supple. No thyromegaly present.  Cardiovascular: Normal rate, regular rhythm, normal heart sounds and intact distal pulses.   No murmur heard. Pulmonary/Chest: Effort normal and breath sounds normal. No respiratory distress. He has no wheezes. He has no rales.  Abdominal: Soft. Bowel sounds are normal. He exhibits no distension and no mass. There is no tenderness. There is no rebound and no guarding.  No hepatosplenomegaly  Musculoskeletal: He exhibits no edema.  r calf with atrophy R 3rd toe hammering-good pulses sens intact over R plantar aspect tho not as good as L Good motor   Lymphadenopathy:    He has no cervical adenopathy.  Neurological: He is alert and oriented to person, place, and time. He has normal reflexes. No cranial nerve deficit. Coordination normal.  Skin: Skin is warm and dry. No rash noted.  Psychiatric: He has a normal mood and affect. His behavior is normal. Judgment and thought content normal.   BP 127/88 mmHg  Pulse 68  Temp(Src) 98.6 F (37 C) (Oral)  Resp 16  Ht 5' 9.5" (1.765 m)  Wt 213 lb (96.616 kg)  BMI 31.01 kg/m2  SpO2 95% Using walker/NAD       Assessment & Plan:  CPE Medicare wellness  Chronic problems- 1/htn 2/Hl  3/neuromusc disorder with DDD lumbar=Atrophy R calf//And Unsteady gait  progr decrease in activity, tremors  Cerebral microvasc dz  4/New hammer toe R 3rd--To podiatry due to pain with  ambulation/shoes 5/Continued erec dys--wants to retry viagra 6/ Mood disorder-w/ depression 7/ stable hearing loss    meds all UTD Wants to wait on prevnar as thinks he's had it

## 2013-12-25 LAB — PSA, MEDICARE: PSA: 0.53 ng/mL (ref <=4.00)

## 2013-12-25 LAB — TSH: TSH: 3.397 u[IU]/mL (ref 0.350–4.500)

## 2013-12-30 ENCOUNTER — Encounter: Payer: Self-pay | Admitting: Internal Medicine

## 2013-12-30 LAB — HEMOGLOBIN A1C
HEMOGLOBIN A1C: 5.8 % — AB (ref <5.7)
Mean Plasma Glucose: 120 mg/dL — ABNORMAL HIGH (ref <117)

## 2014-01-05 ENCOUNTER — Other Ambulatory Visit: Payer: Self-pay | Admitting: Internal Medicine

## 2014-01-08 ENCOUNTER — Encounter: Payer: Self-pay | Admitting: Podiatry

## 2014-01-08 ENCOUNTER — Ambulatory Visit (INDEPENDENT_AMBULATORY_CARE_PROVIDER_SITE_OTHER): Payer: Medicare Other | Admitting: Podiatry

## 2014-01-08 ENCOUNTER — Ambulatory Visit (INDEPENDENT_AMBULATORY_CARE_PROVIDER_SITE_OTHER): Payer: Medicare Other

## 2014-01-08 VITALS — BP 123/70 | HR 69 | Resp 16

## 2014-01-08 DIAGNOSIS — M2040 Other hammer toe(s) (acquired), unspecified foot: Secondary | ICD-10-CM | POA: Diagnosis not present

## 2014-01-08 NOTE — Progress Notes (Signed)
   Subjective:    Patient ID: Reginald Edwards, male    DOB: 02-09-1945, 69 y.o.   MRN: [id number]  HPI Comments: "I have hammer toes"  Patient c/o tender 2nd toe right and 3rd toes bilateral for several months. The 3rd toes are red at the knuckle. They rub his shoes and he can feel the toenail pressing at the tip. He has tried several paddings-no help.  Toe Pain       Review of Systems  HENT: Positive for hearing loss and tinnitus.   Musculoskeletal: Positive for back pain, arthralgias and gait problem.  Neurological: Positive for tremors.  All other systems reviewed and are negative.      Objective:   Physical Exam        Assessment & Plan:

## 2014-01-08 NOTE — Patient Instructions (Signed)
Hammer Toes Hammer toes is a condition in which one or more of your toes is permanently flexed. CAUSES  This happens when a muscle imbalance or abnormal bone length makes your small toes buckle. This causes the toe joint to contract and the strong cord-like bands that attach muscles to the bones (tendons) in your toes to shorten.  SIGNS AND SYMPTOMS  Common symptoms of flexible hammer toes include:   A buildup of skin cells (corns). Corns occur where boney bumps come in frequent contact with hard surfaces. For example, where your shoes press and rub.  Irritation.  Inflammation.  Pain.  Limited motion in your toes. DIAGNOSIS  Hammer toes are diagnosed through a physical exam of your toes. During the exam, your health care provider may try to reproduce your symptoms by manipulating your foot. Often, X-ray exams are done to determine the degree of deformity and to make sure that the cause is not a fracture.  TREATMENT  Hammer toes can be treated with corrective surgery. There are several types of surgical procedures that can treat hammer toes. The most common procedures include:  Arthroplasty--A portion of the joint is surgically removed and your toe is straightened. The gap fills in with fibrous tissue. This procedure helps treat pain and deformity and helps restore function.  Fusion--Cartilage between the two bones of the affected joint is taken out and the bones fuse together into one longer bone. This helps keep your toe stable and reduces pain but leaves your toe stiff, yet straight.  Implantation--A portion of your bone is removed and replaced with an implant to restore motion.  Flexor tendon transfers--This procedure repositions the tendons that curl the toes down (flexor tendons). This may be done to release the deforming force that causes your toe to buckle. Several of these procedures require fixing your toe with a pin that is visible at the tip of your toe. The pin keeps the toe  straight during healing. Your health care provider will remove the pin usually within 4-8 weeks after the procedure.  Document Released: 12/17/1999 Document Revised: 12/24/2012 Document Reviewed: 08/26/2012 [facility] Patient Information 2015 [place], [place]. This information is not intended to replace advice given to you by your health care provider. Make sure you discuss any questions you have with your health care provider.

## 2014-01-09 NOTE — Progress Notes (Signed)
Subjective:     Patient ID: Reginald Edwards, male   DOB: 11-16-1945, 69 y.o.   MRN: [id number]  HPI Asian presents with painful toes second and third left and second and third right stating that he has trouble at times wearing shoes and does have a history of severe back problems   Review of Systems  All other systems reviewed and are negative.      Objective:   Physical Exam  Constitutional: He is oriented to person, place, and time.  Cardiovascular: Intact distal pulses.   Musculoskeletal: Normal range of motion.  Neurological: He is oriented to person, place, and time.  Skin: Skin is warm.  Nursing note and vitals reviewed.  neurovascular status intact with muscle strength adequate and range of motion subtalar and midtarsal joint within normal limits. Patient's noted to have digital elevation second and third of both feet with relatively rigid contracture right over left and redness on top of the toes but does have foot drop right secondary to the severe back problems that he has had over the years. Digits are well-perfused and he is well oriented 3     Assessment:     Structural deformity of toes that mostly is caused by his severe back problems and probable neurological condition    Plan:     H&P and x-rays reviewed and today I applied padding and I do not recommend surgery unless absolutely necessary. He will hopefully do well with pads and will be reevaluated again if symptoms indicate

## 2014-01-10 DIAGNOSIS — M659 Synovitis and tenosynovitis, unspecified: Secondary | ICD-10-CM | POA: Diagnosis not present

## 2014-01-10 DIAGNOSIS — M65841 Other synovitis and tenosynovitis, right hand: Secondary | ICD-10-CM | POA: Diagnosis not present

## 2014-02-09 ENCOUNTER — Ambulatory Visit: Payer: Self-pay | Admitting: Diagnostic Neuroimaging

## 2014-02-12 ENCOUNTER — Ambulatory Visit (INDEPENDENT_AMBULATORY_CARE_PROVIDER_SITE_OTHER): Payer: Medicare Other | Admitting: Diagnostic Neuroimaging

## 2014-02-12 ENCOUNTER — Encounter: Payer: Self-pay | Admitting: Diagnostic Neuroimaging

## 2014-02-12 VITALS — BP 130/82 | HR 60 | Ht 68.0 in | Wt 202.5 lb

## 2014-02-12 DIAGNOSIS — M4806 Spinal stenosis, lumbar region: Secondary | ICD-10-CM

## 2014-02-12 DIAGNOSIS — M48061 Spinal stenosis, lumbar region without neurogenic claudication: Secondary | ICD-10-CM

## 2014-02-12 DIAGNOSIS — M5416 Radiculopathy, lumbar region: Secondary | ICD-10-CM | POA: Diagnosis not present

## 2014-02-12 DIAGNOSIS — G4730 Sleep apnea, unspecified: Secondary | ICD-10-CM | POA: Diagnosis not present

## 2014-02-12 NOTE — Patient Instructions (Signed)
Will setup sleep study and lumbar spine evaluations.
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
## 2014-03-09 ENCOUNTER — Telehealth: Payer: Self-pay | Admitting: Neurology

## 2014-03-09 DIAGNOSIS — G4730 Sleep apnea, unspecified: Secondary | ICD-10-CM

## 2014-03-09 DIAGNOSIS — R0683 Snoring: Secondary | ICD-10-CM

## 2014-03-09 NOTE — Telephone Encounter (Signed)
Reginald Spearman, MD refers patient for attended sleep study.  Height: 5'8"  Weight: 202 lb 8 oz  BMI: 30.80   Past Medical History:  Hyperlipemia    . Essential hypertension   . Mood disorder   . Hearing loss   . Arthritis   . Neuromuscular disorder   . Depression   . Anxiety   . DJD (degenerative joint disease) of lumbar spine   . Cancer     skin            Sleep Symptoms: History of sleep apnea dx and CPAP in the past, not used many years ago.   Epworth Score: Unable to reach patient   Medication:  ALPRAZolam (Tab) XANAX 0.25 MG Take 0.25 mg by mouth 3 (three) times daily.       Amphetamine-Dextroamphetamine (Tab) ADDERALL 20 MG Take 20 mg by mouth 3 (three) times daily.       Aspirin (Tablet Delayed Response) aspirin 325 MG Take 325 mg by mouth daily.      Atorvastatin Calcium (Tab) LIPITOR 20 MG Take 1 tablet by mouth every day      BuPROPion HCl (Tablet SR 12 hr) WELLBUTRIN SR 200 MG Take 200 mg by mouth 3 (three) times daily.      Caffeine (Tab) caffeine 200 MG Take 200 mg by mouth 3 (three) times daily. With breakfast and lunch      Cholecalciferol (Tab) Vitamin D3 2000 UNITS Take by mouth daily.      Famotidine (Tab) PEPCID 20 MG Take 20 mg by mouth as needed for heartburn or indigestion.      Losartan Potassium-HCTZ (Tab) HYZAAR 50-12.5 MG Take 1 tablet by mouth Daily      Misc. Devices (Misc) Roller Walker  USE AS DIRECTED. DX: 340, V15.88      Multiple Vitamin (Tab) multivitamin  Take 1 tablet by mouth daily.      Naproxen   Take 1-2 tablets by mouth 2 (two) times daily as needed.      Propranolol HCl (Capsule SR 24 hr) INDERAL LA 80 MG Take 1 capsule by mouth daily      Sildenafil Citrate (Tab) VIAGRA 100 MG Take 0.5-1 tablets (50-100 mg total) by mouth daily as needed for erectile dysfunction.      Tamsulosin HCl (Cap) FLOMAX 0.4 MG Take 0.4 mg by mouth.      Temazepam (Cap)  RESTORIL 30 MG       .       Ins: Medicare/AARP   Assessment & Plan: 69 y.o. year old male  has a past medical history of Hyperlipemia; Essential hypertension; Mood disorder; Hearing loss; Arthritis; Neuromuscular disorder; Depression; Anxiety; DJD (degenerative joint disease) of lumbar spine; and Cancer.  Initially thought to have multiple sclerosis by [facility] team in [facility] and Dr. Erling Cruz, started on tecfidera, but now eval'd by MS clinic at [facility], who do not think MS is the correct diagnosis. I reviewed [facility] notes and agree. In retrospect, I think his lower extremity issues and gait difficulty all come from lumbar radiculopathy and spinal stenosis issues. I advised patient to get another opinion about lumbar spine disease, and he wants to have this at [facility], with Dr. Jenne Campus ([place]).   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 Jenne Campus) - sleep study to re-eval sleep apnea dx from many yrs ago  Return in about 6 months (around 08/13/2014).    Please review patient information and submit instructions for scheduling and orders for sleep technologist. Thank you.

## 2014-03-12 ENCOUNTER — Telehealth: Payer: Self-pay | Admitting: Neurology

## 2014-03-12 NOTE — Telephone Encounter (Signed)
LVM to return call back to schedule study

## 2014-03-23 ENCOUNTER — Emergency Department (HOSPITAL_COMMUNITY)
Admission: EM | Admit: 2014-03-23 | Discharge: 2014-03-23 | Disposition: A | Discharge status: Home / Self Care | Payer: Medicare Other | Attending: Emergency Medicine | Admitting: Emergency Medicine

## 2014-03-23 ENCOUNTER — Ambulatory Visit (INDEPENDENT_AMBULATORY_CARE_PROVIDER_SITE_OTHER): Payer: Medicare Other | Admitting: Emergency Medicine

## 2014-03-23 ENCOUNTER — Encounter (HOSPITAL_COMMUNITY): Payer: Self-pay

## 2014-03-23 VITALS — BP 100/60 | HR 60 | Temp 97.8°F | Resp 18 | Ht 68.5 in | Wt 211.8 lb

## 2014-03-23 DIAGNOSIS — L03211 Cellulitis of face: Secondary | ICD-10-CM

## 2014-03-23 DIAGNOSIS — F329 Major depressive disorder, single episode, unspecified: Secondary | ICD-10-CM | POA: Insufficient documentation

## 2014-03-23 DIAGNOSIS — Z79899 Other long term (current) drug therapy: Secondary | ICD-10-CM | POA: Insufficient documentation

## 2014-03-23 DIAGNOSIS — R21 Rash and other nonspecific skin eruption: Secondary | ICD-10-CM

## 2014-03-23 DIAGNOSIS — F419 Anxiety disorder, unspecified: Secondary | ICD-10-CM | POA: Diagnosis not present

## 2014-03-23 DIAGNOSIS — Z8659 Personal history of other mental and behavioral disorders: Secondary | ICD-10-CM | POA: Insufficient documentation

## 2014-03-23 DIAGNOSIS — Z7982 Long term (current) use of aspirin: Secondary | ICD-10-CM | POA: Insufficient documentation

## 2014-03-23 DIAGNOSIS — Z85828 Personal history of other malignant neoplasm of skin: Secondary | ICD-10-CM | POA: Diagnosis not present

## 2014-03-23 DIAGNOSIS — Z131 Encounter for screening for diabetes mellitus: Secondary | ICD-10-CM

## 2014-03-23 DIAGNOSIS — Z791 Long term (current) use of non-steroidal anti-inflammatories (NSAID): Secondary | ICD-10-CM | POA: Diagnosis not present

## 2014-03-23 DIAGNOSIS — R220 Localized swelling, mass and lump, head: Secondary | ICD-10-CM | POA: Diagnosis not present

## 2014-03-23 DIAGNOSIS — E785 Hyperlipidemia, unspecified: Secondary | ICD-10-CM | POA: Insufficient documentation

## 2014-03-23 DIAGNOSIS — M1990 Unspecified osteoarthritis, unspecified site: Secondary | ICD-10-CM | POA: Diagnosis not present

## 2014-03-23 DIAGNOSIS — I10 Essential (primary) hypertension: Secondary | ICD-10-CM | POA: Diagnosis not present

## 2014-03-23 DIAGNOSIS — T7840XA Allergy, unspecified, initial encounter: Secondary | ICD-10-CM | POA: Diagnosis not present

## 2014-03-23 LAB — POCT CBC
GRANULOCYTE PERCENT: 86.2 % — AB (ref 37–80)
HCT, POC: 43.8 % (ref 43.5–53.7)
Hemoglobin: 14.9 g/dL (ref 14.1–18.1)
Lymph, poc: 1.3 (ref 0.6–3.4)
MCH, POC: 30.9 pg (ref 27–31.2)
MCHC: 34 g/dL (ref 31.8–35.4)
MCV: 91 fL (ref 80–97)
MID (CBC): 1 — AB (ref 0–0.9)
MPV: 7.7 fL (ref 0–99.8)
PLATELET COUNT, POC: 193 10*3/uL (ref 142–424)
POC Granulocyte: 14.6 — AB (ref 2–6.9)
POC LYMPH %: 7.7 % — AB (ref 10–50)
POC MID %: 6.1 %M (ref 0–12)
RBC: 4.81 M/uL (ref 4.69–6.13)
RDW, POC: 14.1 %
WBC: 16.9 10*3/uL — AB (ref 4.6–10.2)

## 2014-03-23 LAB — GLUCOSE, POCT (MANUAL RESULT ENTRY): POC GLUCOSE: 151 mg/dL — AB (ref 70–99)

## 2014-03-23 MED ORDER — FAMOTIDINE IN NACL 20-0.9 MG/50ML-% IV SOLN
20.0000 mg | Freq: Once | INTRAVENOUS | Status: AC
  Administered 2014-03-23: 20 mg via INTRAVENOUS
  Filled 2014-03-23: qty 50

## 2014-03-23 MED ORDER — PREDNISONE 20 MG PO TABS: 40.0000 mg | ORAL_TABLET | Freq: Every day | ORAL | Status: DC

## 2014-03-23 MED ORDER — DIPHENHYDRAMINE HCL 50 MG/ML IJ SOLN
50.0000 mg | Freq: Once | INTRAMUSCULAR | Status: AC
  Administered 2014-03-23: 50 mg via INTRAVENOUS
  Filled 2014-03-23: qty 1

## 2014-03-23 MED ORDER — CLINDAMYCIN HCL 300 MG PO CAPS: 300.0000 mg | ORAL_CAPSULE | Freq: Four times a day (QID) | ORAL | Status: DC

## 2014-03-23 MED ORDER — METHYLPREDNISOLONE SODIUM SUCC 125 MG IJ SOLR
125.0000 mg | Freq: Once | INTRAMUSCULAR | Status: AC
  Administered 2014-03-23: 125 mg via INTRAVENOUS
  Filled 2014-03-23: qty 2

## 2014-03-23 MED ORDER — CIMETIDINE 200 MG PO TABS: 200.0000 mg | ORAL_TABLET | Freq: Two times a day (BID) | ORAL | Status: DC

## 2014-03-23 MED ORDER — VANCOMYCIN HCL IN DEXTROSE 1-5 GM/200ML-% IV SOLN
1000.0000 mg | Freq: Once | INTRAVENOUS | Status: AC
  Administered 2014-03-23: 1000 mg via INTRAVENOUS
  Filled 2014-03-23: qty 200

## 2014-03-23 NOTE — ED Provider Notes (Addendum)
Medical screening examination/treatment/procedure(s) were conducted as a shared visit with non-physician practitioner(s) and myself.  I personally evaluated the patient during the encounter.   EKG Interpretation None     Patient here with increased facial swelling 2 days and began on the tip of his nose respiratory status. Seen at urgent care where he had leukocytosis. Patient has had direct contact with his dogs and I think that he might have a contact dermatitis that has a secondary cellulitis. Will get medications for both and observe here  8:17 PM   Patient reevaluated and the erythema and edema to his face has greatly improved after treatment with antibiotics as well as steroids. Will place patient on clindamycin as well as steroid along with Pepcid and Benadryl. Return precautions given  Lacretia Leigh, MD 03/23/14 1705  Lacretia Leigh, MD 03/23/14 2018

## 2014-03-23 NOTE — ED Notes (Signed)
Bed: UY23 Expected date:  Expected time:  Means of arrival:  Comments: Ems- facial redness

## 2014-03-23 NOTE — ED Notes (Signed)
Awake. Verbally responsive. A/O x4 with intermittent confusion. Resp even and unlabored. No audible adventitious breath sounds noted. ABC's intact. Family at bedside. IV saline lock patent and intact.

## 2014-03-23 NOTE — Discharge Instructions (Signed)
Please use prescribed medications as directed. Please use Benadryl in addition. Please monitor for new or worsening signs or symptoms and return immediately if any present. Follow up with your primary care provider for further evaluation.

## 2014-03-23 NOTE — ED Notes (Signed)
MD at bedside. EDPA JEFF PRESENT TO EVALUATE THIS PT

## 2014-03-23 NOTE — ED Notes (Addendum)
PT SEEN AT A [facility] AND GIVEN BLOOD. PLEASE SEE PORTAL FOR LAB WORK DRAWN THIS AM

## 2014-03-23 NOTE — Progress Notes (Addendum)
Subjective:  This chart was scribed for Arlyss Queen MD,  by Tamsen Roers, at [facility].  This patient was seen in room 8 and the patient's care was started at 11:51 AM.    Patient ID: Reginald Edwards, male    DOB: May 18, 1945, 69 y.o.   MRN: [id number]  HPI  HPI Comments: Reginald Edwards is a 69 y.o. male who presents to [facility] for facial swelling/redness onset two days ago and notes that he feels very uncomfortable but does not have any sort of facial pain. Patient states that the rash first started in his cheek area and spread up to his nose and now has spread all the way to his scalp.  Patient has not measured his temperature but states that he does feel warm.  Patient has not had any recent changes in medication.  Patient has never had cellulitis before.  Patient has no other complaints today.    Patient Active Problem List   Diagnosis Date Noted  . Postural tremor 08/08/2013  . 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 Cervical disc disorder with radiculopathy of cervical region 08/08/2013  . 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 Depression 08/08/2013  . Right foot drop 12/18/2012  . Spinal stenosis of lumbar region 03/20/2012  . Lumbar radiculopathy 03/20/2012  . BETremor 12/06/2011  . Chest discomfort 10/02/2011  . Exertional dyspnea 10/02/2011  . Mood disorder 12/23/2010  . Hyperlipidemia 12/23/2010  . Essential hypertension, benign 12/23/2010  . Hearing loss 12/23/2010   Past Medical History  Diagnosis Date  . Hyperlipemia   . Essential hypertension   . Mood disorder   . Hearing loss   . Arthritis   . Neuromuscular disorder   . Depression   . Anxiety   . DJD (degenerative joint disease) of lumbar spine   . Cancer     skin    Past Surgical History  Procedure Laterality Date  . Eye surgery    . Tonsillectomy    . Shoulder surgery     Allergies  Allergen Reactions  . Clorazepate     Makes him extremely groggy  .  Lisinopril     REACTION: constant cough,irritation  . Vicodin [Hydrocodone-Acetaminophen]     Vomiting, drowsy   Prior to Admission medications   Medication Sig Start Date End Date Taking? Authorizing Provider  ALPRAZolam (XANAX) 0.25 MG tablet Take 0.25 mg by mouth 3 (three) times daily.    Historical Provider, MD  amphetamine-dextroamphetamine (ADDERALL) 20 MG tablet Take 20 mg by mouth 3 (three) times daily.      Historical Provider, MD  aspirin 325 MG EC tablet Take 325 mg by mouth daily.    Historical Provider, MD  atorvastatin (LIPITOR) 20 MG tablet Take 1 tablet by mouth  every day 10/24/13   Leandrew Koyanagi, MD  buPROPion [facility] SR) 200 MG 12 hr tablet Take 200 mg by mouth 3 (three) times daily.    Historical Provider, MD  caffeine 200 MG TABS Take 200 mg by mouth 3 (three) times daily. With breakfast and lunch    Historical Provider, MD  Cholecalciferol (VITAMIN D3) 2000 UNITS TABS Take by mouth daily.    Historical Provider, MD  famotidine (PEPCID) 20 MG tablet Take 20 mg by mouth as needed for heartburn or indigestion.    Historical Provider, MD  losartan-hydrochlorothiazide Konrad Penta) 50-12.5 MG per tablet Take 1 tablet by mouth  Daily 10/24/13   Leandrew Koyanagi, MD  Misc. Devices (  ROLLER WALKER) MISC USE AS DIRECTED. DX: 340, V15.88 01/15/13   Mancel Bale, PA-C  Multiple Vitamin (MULTIVITAMIN) tablet Take 1 tablet by mouth daily.    Historical Provider, MD  Naproxen (NAPROSYN PO) Take 1-2 tablets by mouth 2 (two) times daily as needed.    Historical Provider, MD  propranolol ER (INDERAL LA) 80 MG 24 hr capsule Take 1 capsule by mouth  daily 01/07/14   Leandrew Koyanagi, MD  sildenafil (VIAGRA) 100 MG tablet Take 0.5-1 tablets (50-100 mg total) by mouth daily as needed for erectile dysfunction. 12/24/13   Leandrew Koyanagi, MD  tamsulosin (FLOMAX) 0.4 MG CAPS capsule Take 0.4 mg by mouth.    Historical Provider, MD  temazepam (RESTORIL) 30 MG capsule  10/21/13   Historical  Provider, MD   History   Social History  . Marital Status: Married    Spouse Name: Diane  . Number of Children: 5  . Years of Education: college   Occupational History  . Retired    Social History Main Topics  . Smoking status: Never Smoker   . Smokeless tobacco: Never Used  . Alcohol Use: No  . Drug Use: No  . Sexual Activity: No   Other Topics Concern  . Not on file   Social History Narrative   Patient lives at home with spouse.   Caffeine Use: rarely;med       Review of Systems  HENT: Positive for facial swelling. Negative for drooling.   Eyes: Negative for discharge.  Gastrointestinal: Negative for nausea and vomiting.  Skin: Positive for color change.       Objective:   Physical Exam CONSTITUTIONAL: Well developed/well nourished HEAD: Normocephalic/atraumatic EYES: EOMI/PERRL ENMT: Mucous membranes moist NECK: supple no meningeal signs SPINE/BACK:entire spine nontender CV: S1/S2 noted, no murmurs/rubs/gallops noted LUNGS: Lungs are clear to auscultation bilaterally, no apparent distress ABDOMEN: soft, nontender, no rebound or guarding, bowel sounds noted throughout abdomen GU:no cva tenderness NEURO: Pt is awake/alert/appropriate, moves all extremitiesx4.  No facial droop.   EXTREMITIES: pulses normal/equal, full ROM SKIN: He has extensive redness and puffiness around both eyes,cheeks, nose, upper lip and frontal scalp. There are pustules present across the nose.  PSYCH: no abnormalities of mood noted, alert and oriented to situation   Filed Vitals:   03/23/14 1138  BP: 100/60  Pulse: 60  Temp: 97.8 F (36.6 C)  TempSrc: Oral  Resp: 18  Height: 5' 8.5" (1.74 m)  Weight: 211 lb 12.8 oz (96.072 kg)  SpO2: 94%   Results for orders placed or performed in visit on 03/23/14  POCT CBC  Result Value Ref Range   WBC 16.9 (A) 4.6 - 10.2 K/uL   Lymph, poc 1.3 0.6 - 3.4   POC LYMPH PERCENT 7.7 (A) 10 - 50 %L   MID (cbc) 1.0 (A) 0 - 0.9   POC MID %  6.1 0 - 12 %M   POC Granulocyte 14.6 (A) 2 - 6.9   Granulocyte percent 86.2 (A) 37 - 80 %G   RBC 4.81 4.69 - 6.13 M/uL   Hemoglobin 14.9 14.1 - 18.1 g/dL   HCT, POC 43.8 43.5 - 53.7 %   MCV 91.0 80 - 97 fL   MCH, POC 30.9 27 - 31.2 pg   MCHC 34.0 31.8 - 35.4 g/dL   RDW, POC 14.1 %   Platelet Count, POC 193 142 - 424 K/uL   MPV 7.7 0 - 99.8 fL  POCT glucose (manual entry)  Result  Value Ref Range   POC Glucose 151 (A) 70 - 99 mg/dl         Assessment & Plan:    Patient has what appears to be a cellulitis of the face which appeared to originate on the tip of his nose.  There are pustules present across the bridge of the nose.  Patient will be sent to the hospital and ER will be notified.  Will check white blood count and glucose.  Will contact the ER prior to him leaving. One blood pressure checked here was 86/60 another was 90/60 so he was sent via EMS. I personally performed the services described in this documentation, which was scribed in my presence. The recorded information has been reviewed and is accurate.

## 2014-03-23 NOTE — ED Notes (Signed)
Awake. Verbally responsive. A/O x4 with intermittent confusion. Resp even and unlabored. No audible adventitious breath sounds noted. ABC's intact.

## 2014-03-23 NOTE — ED Provider Notes (Signed)
CSN: [id number]     Arrival date & time 03/23/14  1329 History   First MD Initiated Contact with Patient 03/23/14 1554     Chief Complaint  Patient presents with  . Facial Swelling    HPI   69 year old male presents with facial swelling. Patient reports that 2 days ago he noticed redness. To his eyebrows that is slowly developed into swelling, itchiness, erythema, warmth. Patient reports that the only known abnormal contact his with his 2 dogs for which she is very hands-on with. Patient denies a history of the same previously. Patient reports that the swelling has slowly increased, but denies difficulty breathing, tongue swelling, difficult swallowing, abnormal sensations his mouth or throat, shortness of breath, chest pain, abdominal pain, any rash other than to his face. Patient reports he was seen at urgent care today subsequent labs and follow-up in emergency room.    Past Medical History  Diagnosis Date  . Hyperlipemia   . Essential hypertension   . Mood disorder   . Hearing loss   . Arthritis   . Neuromuscular disorder   . Depression   . Anxiety   . DJD (degenerative joint disease) of lumbar spine   . Cancer     skin    Past Surgical History  Procedure Laterality Date  . Eye surgery    . Tonsillectomy    . Shoulder surgery     Family History  Problem Relation Age of Onset  . Cancer Mother   . Cancer Father     lung - because of smoking  . Diabetes Brother     type 2  . Cancer Brother   . Heart disease Brother   . Hypertension Brother   . Colon cancer Neg Hx   . Esophageal cancer Neg Hx   . Pancreatic cancer Neg Hx   . Prostate cancer Neg Hx   . Rectal cancer Neg Hx   . Stomach cancer Neg Hx   . Cancer Brother   . Heart disease Brother   . Hypertension Brother    History  Substance Use Topics  . Smoking status: Never Smoker   . Smokeless tobacco: Never Used  . Alcohol Use: No    Review of Systems  All other systems reviewed and are  negative.   Allergies  Clorazepate; Lisinopril; and Vicodin  Home Medications   Prior to Admission medications   Medication Sig Start Date End Date Taking? Authorizing Provider  ALPRAZolam (XANAX) 0.25 MG tablet Take 0.25 mg by mouth 3 (three) times daily.   Yes Historical Provider, MD  amphetamine-dextroamphetamine (ADDERALL) 20 MG tablet Take 20 mg by mouth 3 (three) times daily.     Yes Historical Provider, MD  aspirin 325 MG EC tablet Take 325 mg by mouth daily.   Yes Historical Provider, MD  atorvastatin (LIPITOR) 20 MG tablet Take 1 tablet by mouth  every day 10/24/13  Yes Leandrew Koyanagi, MD  buPROPion [facility] SR) 200 MG 12 hr tablet Take 200 mg by mouth 3 (three) times daily.   Yes Historical Provider, MD  caffeine 200 MG TABS Take 200 mg by mouth 2 (two) times daily. With breakfast and lunch   Yes Historical Provider, MD  Cholecalciferol (VITAMIN D3) 5000 UNITS CAPS Take 5,000 Units by mouth daily.   Yes Historical Provider, MD  famotidine (PEPCID) 20 MG tablet Take 20 mg by mouth as needed for heartburn or indigestion.   Yes Historical Provider, MD  losartan-hydrochlorothiazide (HYZAAR) 50-12.5 MG per  tablet Take 1 tablet by mouth  Daily 10/24/13  Yes Leandrew Koyanagi, MD  Misc. Devices (ROLLER WALKER) MISC USE AS DIRECTED. DX: 340, V15.88 01/15/13  Yes Mancel Bale, PA-C  Multiple Vitamin (MULTIVITAMIN) tablet Take 1 tablet by mouth daily.   Yes Historical Provider, MD  Naproxen (NAPROSYN PO) Take 220 mg by mouth 2 (two) times daily as needed (pain).    Yes Historical Provider, MD  tamsulosin (FLOMAX) 0.4 MG CAPS capsule Take 0.4 mg by mouth daily.    Yes Historical Provider, MD  temazepam (RESTORIL) 30 MG capsule Take 30 mg by mouth at bedtime.  10/21/13  Yes Historical Provider, MD  propranolol ER (INDERAL LA) 80 MG 24 hr capsule Take 1 capsule by mouth  daily 01/07/14   Leandrew Koyanagi, MD  sildenafil (VIAGRA) 100 MG tablet Take 0.5-1 tablets (50-100 mg total) by  mouth daily as needed for erectile dysfunction. Patient not taking: Reported on 03/23/2014 12/24/13   Leandrew Koyanagi, MD   BP 116/67 mmHg  Pulse 64  Temp(Src) 98.1 F (36.7 C) (Oral)  Resp 14  SpO2 97% Physical Exam  Constitutional: He is oriented to person, place, and time. He appears well-developed and well-nourished.  HENT:  Head: Normocephalic and atraumatic.  Right Ear: Tympanic membrane, external ear and ear canal normal.  Left Ear: Tympanic membrane, external ear and ear canal normal.  Nose: Nose normal.  Mouth/Throat: Uvula is midline, oropharynx is clear and moist and mucous membranes are normal.  Eyes: Pupils are equal, round, and reactive to light.  Neck: Normal range of motion. Neck supple. No JVD present. No tracheal deviation present. No thyromegaly present.  Cardiovascular: Normal rate, regular rhythm, normal heart sounds and intact distal pulses.  Exam reveals no gallop and no friction rub.   No murmur heard. Pulmonary/Chest: Effort normal and breath sounds normal. No stridor. No respiratory distress. He has no wheezes. He has no rales. He exhibits no tenderness.  Musculoskeletal: Normal range of motion.  Lymphadenopathy:    He has no cervical adenopathy.  Neurological: He is alert and oriented to person, place, and time. Coordination normal.  Skin: Skin is warm and dry.  Well-demarcated erythematous swelling around the eyes and nose and forehead.  Psychiatric: He has a normal mood and affect. His behavior is normal. Judgment and thought content normal.  Nursing note and vitals reviewed.   ED Course  Procedures (including critical care time) Labs Review Labs Reviewed - No data to display  Imaging Review No results found.   EKG Interpretation None     MDM   Final diagnoses:  Allergic reaction, initial encounter  Cellulitis of face     Labs: Leukocytosis  Therapeutics: Methylprednisolone, Pepcid, Benadryl, vancomycin  Assessment: Allergic  reaction possible cellulitis  Plan: Patient was discharged home with clindamycin, Zantac, prednisone, instructions to take Benadryl. Instructed monitor for new or worsening signs or symptoms including shortness of breath throat swelling tongue swelling or any other concerning signs or symptoms. He is instructed to return immediately if any above present. Patient understood and agreed with plan.      Okey Regal, PA-C 03/24/14 1452

## 2014-03-23 NOTE — ED Notes (Signed)
Awake. Verbally responsive. A/O x4 with intermittent confusion. Resp even and unlabored. No audible adventitious breath sounds noted. ABC's intact. SR on monitor. Noted improvement with redness and swelling to face.

## 2014-03-23 NOTE — ED Notes (Signed)
Reginald Edwards (wife) cell # is [phone]

## 2014-03-23 NOTE — ED Notes (Signed)
Per [facility]. Pt c/o of swelling to face since Saturday. Seen by urgent care this am. Sent here for evaluation of facial swelling. Denies fever. Airway intact. Mark from urgent care

## 2014-03-26 LAB — WOUND CULTURE
Gram Stain: NONE SEEN
Gram Stain: NONE SEEN
Organism ID, Bacteria: NO GROWTH

## 2014-04-02 NOTE — Telephone Encounter (Signed)
Best to see patient for sleep consult- we need old sleep study and Epworth score.   I will be out, can he be seen by Dr Rexene Alberts?  Or can he wait until I return. He may have seen me for OSA while treated by dr Erling Cruz. CD

## 2014-04-26 ENCOUNTER — Ambulatory Visit (INDEPENDENT_AMBULATORY_CARE_PROVIDER_SITE_OTHER): Payer: Medicare Other | Admitting: Neurology

## 2014-04-26 DIAGNOSIS — R0683 Snoring: Secondary | ICD-10-CM

## 2014-04-26 DIAGNOSIS — G4730 Sleep apnea, unspecified: Secondary | ICD-10-CM

## 2014-04-27 NOTE — Sleep Study (Signed)
See scanned documents in Encounters tab

## 2014-05-05 ENCOUNTER — Other Ambulatory Visit: Payer: Self-pay | Admitting: Internal Medicine

## 2014-05-08 ENCOUNTER — Other Ambulatory Visit: Payer: Self-pay | Admitting: Internal Medicine

## 2014-05-13 ENCOUNTER — Telehealth: Payer: Self-pay

## 2014-05-13 NOTE — Telephone Encounter (Signed)
Called pt to give him sleep study results. Informed pt of the mild sleep apnea seen during study and of the afib that was seen during study on EKG. Informed pt that Dr. Brett Fairy recommends cpap treatment because of the associated hypoxemia and the risk for afib/cardiac arrhythmia if left untreated. Pt said he has been on cpap before and it was a huge hassle and he did not want to start it again. I advised discussing it with his PCP and Dr. Leta Baptist especially in light of the afib seen in study. Pt verbalized understanding and said he would discuss it with both doctors.

## 2014-06-27 ENCOUNTER — Telehealth: Payer: Self-pay

## 2014-06-27 NOTE — Telephone Encounter (Signed)
He is a pt of Dr. [name], he would like him to get him a handicap license plate for his car. He would prefer not to come in for this. Please advise at [phone]

## 2014-06-29 NOTE — Telephone Encounter (Signed)
Does pt have to come in for this? 

## 2014-06-29 NOTE — Telephone Encounter (Signed)
Hope for permanent handicap sticker

## 2014-06-30 NOTE — Telephone Encounter (Signed)
Form in your box to sign.

## 2014-07-16 DIAGNOSIS — H2513 Age-related nuclear cataract, bilateral: Secondary | ICD-10-CM | POA: Diagnosis not present

## 2014-07-16 DIAGNOSIS — H1789 Other corneal scars and opacities: Secondary | ICD-10-CM | POA: Diagnosis not present

## 2014-07-16 DIAGNOSIS — H43813 Vitreous degeneration, bilateral: Secondary | ICD-10-CM | POA: Diagnosis not present

## 2014-07-17 DIAGNOSIS — R3912 Poor urinary stream: Secondary | ICD-10-CM | POA: Diagnosis not present

## 2014-07-17 DIAGNOSIS — N319 Neuromuscular dysfunction of bladder, unspecified: Secondary | ICD-10-CM | POA: Diagnosis not present

## 2014-07-20 ENCOUNTER — Telehealth: Payer: Self-pay

## 2014-07-20 NOTE — Telephone Encounter (Signed)
Pt dropped off a form that needs to be filled out by Dr. [name] or Dr. Everlene Farrier concerning "jury summons". Pt would also needs a durable goods script for his back and leg brace. He says he uses these  all time and the Velcro is starting to wear off. Please advise at [phone].

## 2014-07-20 NOTE — Telephone Encounter (Signed)
In your box, Dr. [name]

## 2014-07-23 NOTE — Telephone Encounter (Signed)
Clld pt advsd forms were completed and ready for pick up at the front desk. Pt  Thanked me and hung up.

## 2014-08-17 ENCOUNTER — Ambulatory Visit (INDEPENDENT_AMBULATORY_CARE_PROVIDER_SITE_OTHER): Payer: Medicare Other | Admitting: Diagnostic Neuroimaging

## 2014-08-17 ENCOUNTER — Encounter: Payer: Self-pay | Admitting: Diagnostic Neuroimaging

## 2014-08-17 VITALS — BP 121/83 | HR 69 | Wt 223.0 lb

## 2014-08-17 DIAGNOSIS — M48061 Spinal stenosis, lumbar region without neurogenic claudication: Secondary | ICD-10-CM

## 2014-08-17 DIAGNOSIS — M4806 Spinal stenosis, lumbar region: Secondary | ICD-10-CM

## 2014-08-17 DIAGNOSIS — G4730 Sleep apnea, unspecified: Secondary | ICD-10-CM
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
## 2014-09-24 ENCOUNTER — Other Ambulatory Visit: Payer: Self-pay | Admitting: Physician Assistant

## 2014-11-03 ENCOUNTER — Other Ambulatory Visit: Payer: Self-pay | Admitting: Internal Medicine

## 2014-11-10 DIAGNOSIS — R51 Headache: Secondary | ICD-10-CM | POA: Diagnosis not present

## 2014-11-10 DIAGNOSIS — M791 Myalgia: Secondary | ICD-10-CM | POA: Diagnosis not present

## 2014-11-10 DIAGNOSIS — M9901 Segmental and somatic dysfunction of cervical region: Secondary | ICD-10-CM | POA: Diagnosis not present

## 2014-11-10 DIAGNOSIS — M9903 Segmental and somatic dysfunction of lumbar region: Secondary | ICD-10-CM | POA: Diagnosis not present

## 2014-11-10 DIAGNOSIS — M9902 Segmental and somatic dysfunction of thoracic region: Secondary | ICD-10-CM | POA: Diagnosis not present

## 2014-11-10 DIAGNOSIS — M9905 Segmental and somatic dysfunction of pelvic region: Secondary | ICD-10-CM | POA: Diagnosis not present

## 2014-11-10 DIAGNOSIS — I69393 Ataxia following cerebral infarction: Secondary | ICD-10-CM | POA: Diagnosis not present

## 2014-11-10 DIAGNOSIS — G464 Cerebellar stroke syndrome: Secondary | ICD-10-CM | POA: Diagnosis not present

## 2014-11-13 ENCOUNTER — Telehealth: Payer: Self-pay | Admitting: Internal Medicine

## 2014-11-13 DIAGNOSIS — M9902 Segmental and somatic dysfunction of thoracic region: Secondary | ICD-10-CM | POA: Diagnosis not present

## 2014-11-13 DIAGNOSIS — M9903 Segmental and somatic dysfunction of lumbar region: Secondary | ICD-10-CM | POA: Diagnosis not present

## 2014-11-13 DIAGNOSIS — M9905 Segmental and somatic dysfunction of pelvic region: Secondary | ICD-10-CM | POA: Diagnosis not present

## 2014-11-13 DIAGNOSIS — R51 Headache: Secondary | ICD-10-CM | POA: Diagnosis not present

## 2014-11-13 DIAGNOSIS — G464 Cerebellar stroke syndrome: Secondary | ICD-10-CM | POA: Diagnosis not present

## 2014-11-13 DIAGNOSIS — I69393 Ataxia following cerebral infarction: Secondary | ICD-10-CM | POA: Diagnosis not present

## 2014-11-13 DIAGNOSIS — M9901 Segmental and somatic dysfunction of cervical region: Secondary | ICD-10-CM | POA: Diagnosis not present

## 2014-11-13 DIAGNOSIS — M791 Myalgia: Secondary | ICD-10-CM | POA: Diagnosis not present

## 2014-11-13 NOTE — Telephone Encounter (Signed)
Pt is returning call from Dr. Daub/Dr. Doolittle/ no notes of outgoing calls are listed in the encounters, if further action is required please contact by MyChart or email.  [phone]

## 2014-11-16 DIAGNOSIS — M9901 Segmental and somatic dysfunction of cervical region: Secondary | ICD-10-CM | POA: Diagnosis not present

## 2014-11-16 DIAGNOSIS — G464 Cerebellar stroke syndrome: Secondary | ICD-10-CM | POA: Diagnosis not present

## 2014-11-16 DIAGNOSIS — M9902 Segmental and somatic dysfunction of thoracic region: Secondary | ICD-10-CM | POA: Diagnosis not present

## 2014-11-16 DIAGNOSIS — M9905 Segmental and somatic dysfunction of pelvic region: Secondary | ICD-10-CM | POA: Diagnosis not present

## 2014-11-16 DIAGNOSIS — I69393 Ataxia following cerebral infarction: Secondary | ICD-10-CM | POA: Diagnosis not present

## 2014-11-16 DIAGNOSIS — R51 Headache: Secondary | ICD-10-CM | POA: Diagnosis not present

## 2014-11-16 DIAGNOSIS — M9903 Segmental and somatic dysfunction of lumbar region: Secondary | ICD-10-CM | POA: Diagnosis not present

## 2014-11-16 DIAGNOSIS — M791 Myalgia: Secondary | ICD-10-CM | POA: Diagnosis not present

## 2014-11-18 DIAGNOSIS — M791 Myalgia: Secondary | ICD-10-CM | POA: Diagnosis not present

## 2014-11-18 DIAGNOSIS — G464 Cerebellar stroke syndrome: Secondary | ICD-10-CM | POA: Diagnosis not present

## 2014-11-18 DIAGNOSIS — M9901 Segmental and somatic dysfunction of cervical region: Secondary | ICD-10-CM | POA: Diagnosis not present

## 2014-11-18 DIAGNOSIS — M9902 Segmental and somatic dysfunction of thoracic region: Secondary | ICD-10-CM | POA: Diagnosis not present

## 2014-11-18 DIAGNOSIS — R51 Headache: Secondary | ICD-10-CM | POA: Diagnosis not present

## 2014-11-18 DIAGNOSIS — I69393 Ataxia following cerebral infarction: Secondary | ICD-10-CM | POA: Diagnosis not present

## 2014-11-18 DIAGNOSIS — M9903 Segmental and somatic dysfunction of lumbar region: Secondary | ICD-10-CM | POA: Diagnosis not present

## 2014-11-18 DIAGNOSIS — M9905 Segmental and somatic dysfunction of pelvic region: Secondary | ICD-10-CM | POA: Diagnosis not present

## 2014-11-25 ENCOUNTER — Ambulatory Visit (INDEPENDENT_AMBULATORY_CARE_PROVIDER_SITE_OTHER): Payer: Medicare Other | Admitting: Internal Medicine

## 2014-11-25 ENCOUNTER — Encounter: Payer: Self-pay | Admitting: Internal Medicine

## 2014-11-25 VITALS — BP 122/82 | HR 73 | Temp 98.5°F | Resp 16 | Ht 69.0 in | Wt 205.0 lb

## 2014-11-25 DIAGNOSIS — Z125 Encounter for screening for malignant neoplasm of prostate: Secondary | ICD-10-CM | POA: Diagnosis not present

## 2014-11-25 DIAGNOSIS — M9901 Segmental and somatic dysfunction of cervical region: Secondary | ICD-10-CM | POA: Diagnosis not present

## 2014-11-25 DIAGNOSIS — E039 Hypothyroidism, unspecified: Secondary | ICD-10-CM

## 2014-11-25 DIAGNOSIS — M9903 Segmental and somatic dysfunction of lumbar region: Secondary | ICD-10-CM | POA: Diagnosis not present

## 2014-11-25 DIAGNOSIS — I10 Essential (primary) hypertension: Secondary | ICD-10-CM

## 2014-11-25 DIAGNOSIS — I69393 Ataxia following cerebral infarction: Secondary | ICD-10-CM | POA: Diagnosis not present

## 2014-11-25 DIAGNOSIS — R51 Headache: Secondary | ICD-10-CM | POA: Diagnosis not present

## 2014-11-25 DIAGNOSIS — G252 Other specified forms of tremor: Secondary | ICD-10-CM | POA: Diagnosis not present

## 2014-11-25 DIAGNOSIS — R739 Hyperglycemia, unspecified: Secondary | ICD-10-CM | POA: Diagnosis not present

## 2014-11-25 DIAGNOSIS — M9902 Segmental and somatic dysfunction of thoracic region: Secondary | ICD-10-CM | POA: Diagnosis not present

## 2014-11-25 DIAGNOSIS — Z23 Encounter for immunization: Secondary | ICD-10-CM

## 2014-11-25 DIAGNOSIS — E785 Hyperlipidemia, unspecified: Secondary | ICD-10-CM

## 2014-11-25 DIAGNOSIS — G464 Cerebellar stroke syndrome: Secondary | ICD-10-CM | POA: Diagnosis not present

## 2014-11-25 DIAGNOSIS — F329 Major depressive disorder, single episode, unspecified: Secondary | ICD-10-CM

## 2014-11-25 DIAGNOSIS — F32A Depression, unspecified: Secondary | ICD-10-CM

## 2014-11-25 DIAGNOSIS — M9905 Segmental and somatic dysfunction of pelvic region: Secondary | ICD-10-CM | POA: Diagnosis not present

## 2014-11-25 DIAGNOSIS — M791 Myalgia: Secondary | ICD-10-CM | POA: Diagnosis not present

## 2014-11-25 MED ORDER — PROPRANOLOL HCL ER 80 MG PO CP24: 80.0000 mg | ORAL_CAPSULE | Freq: Every day | ORAL | Status: DC

## 2014-11-25 MED ORDER — LOSARTAN POTASSIUM-HCTZ 50-12.5 MG PO TABS: ORAL_TABLET | ORAL | Status: DC

## 2014-11-25 MED ORDER — ATORVASTATIN CALCIUM 20 MG PO TABS: ORAL_TABLET | ORAL | Status: DC

## 2014-11-26 NOTE — Progress Notes (Signed)
   Subjective:    Patient ID: Reginald Edwards, male    DOB: [birth year], 69 y.o.   MRN: [id number]  HPIfu Patient Active Problem List   Diagnosis Date Noted  . Right foot drop 12/18/2012    Priority: Medium  . Spinal stenosis of lumbar region 03/20/2012    Priority: Medium  . Lumbar radiculopathy 03/20/2012    Priority: Medium  . Mood disorder ([place]) 12/23/2010    Priority: Medium  . H/O malignant neoplasm of skin---stable-see rec ckup 10/02/2013  . Postural tremor 08/08/2013  . 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 Cervical disc disorder with radiculopathy of cervical region 08/08/2013  . 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 Depression 08/08/2013  . Hyperlipidemia---time for labs///stable on Lipitor 20 mg  12/23/2010  . Essential hypertension, benign///he has had no problems with medications and no elevated blood pressure at any of his other physician follow-ups  12/23/2010  . Hearing loss 12/23/2010   Neurology Dr. Princella Pellegrini Sleep disorder Dr. Ottis Stain Dr. Paulla Dolly Colonoscopy Dr. Henrene Pastor [facility]-Dr [place] Psych[facility] Dr.Kaur  Pneumovax 2014 Flu vaccine September 16  Review of Systems He continues with physical disability limiting activity because of his lower extremity problems from spinal stenosis No recent new symptoms of headache or vision changes. No fever or night sweats. no unexpected weight loss No shortness of breath chest pain No abdominal pain  No genitourinary symptoms     Objective:   Physical Exam  Constitutional: He is oriented to person, place, and time. He appears well-developed and well-nourished. No distress.  HENT:  Mouth/Throat: Oropharynx is clear and moist.  Eyes: EOM are normal. Pupils are equal, round, and reactive to light.  Neck: No thyromegaly present.  Lymphadenopathy:    He has no cervical adenopathy.  Neurological: He is alert and oriented to person, place, and time.   BP 122/82 mmHg  Pulse 73  Temp(Src) 98.5 F (36.9 C)  Resp 16   Ht 5\' 9"  (1.753 m)  Wt 205 lb (92.987 kg)  BMI 30.26 kg/m2 No carotid bruits Heart regular without murmur No peripheral edema There is loss of muscle mass in the calves Cranial nerves II through XII intact Mood is stable       Assessment & Plan:   Need for prophylactic vaccination and inoculation against influenza - Plan: Flu Vaccine QUAD 36+ mos IM  Hyperlipidemia - Plan: Lipid panel  Essential hypertension, benign - Plan: CBC with Differential/Platelet, Comprehensive metabolic panel  Depression--follow-up psychiatrist's  Screening for prostate cancer - Plan: PSA, Medicare  Hypothyroidism, unspecified hypothyroidism type - Plan: TSH  Hyperglycemia in past with last A1C 5.8 1 years ago - Plan: HgB A1c  tremor  Meds ordered this encounter  Medications  . ALPRAZolam (XANAX) 0.25 MG tablet    Sig: Take 0.25 mg by mouth at bedtime as needed for anxiety.  Marland Kitchen losartan-hydrochlorothiazide (HYZAAR) 50-12.5 MG tablet    Sig: TAKE 1 TABLET BY MOUTH DAILY    Dispense:  90 tablet    Refill:  3  . propranolol ER (INDERAL LA) 80 MG 24 hr capsule    Sig: Take 1 capsule (80 mg total) by mouth daily.    Dispense:  90 capsule    Refill:  3  . atorvastatin (LIPITOR) 20 MG tablet    Sig: TAKE 1 TABLET BY MOUTH EVERY DAY    Dispense:  90 tablet    Refill:  3

## 2014-11-30 ENCOUNTER — Encounter: Payer: Self-pay | Admitting: Internal Medicine

## 2014-11-30 ENCOUNTER — Other Ambulatory Visit: Payer: Medicare Other

## 2014-11-30 DIAGNOSIS — E785 Hyperlipidemia, unspecified: Secondary | ICD-10-CM | POA: Diagnosis not present

## 2014-11-30 DIAGNOSIS — E039 Hypothyroidism, unspecified: Secondary | ICD-10-CM | POA: Diagnosis not present

## 2014-11-30 DIAGNOSIS — I10 Essential (primary) hypertension: Secondary | ICD-10-CM | POA: Diagnosis not present

## 2014-11-30 DIAGNOSIS — Z125 Encounter for screening for malignant neoplasm of prostate: Secondary | ICD-10-CM | POA: Diagnosis not present

## 2014-11-30 LAB — CBC WITH DIFFERENTIAL/PLATELET
Basophils Absolute: 0.1 10*3/uL (ref 0.0–0.1)
Basophils Relative: 1 % (ref 0–1)
EOS ABS: 0.4 10*3/uL (ref 0.0–0.7)
EOS PCT: 6 % — AB (ref 0–5)
HEMATOCRIT: 43.4 % (ref 39.0–52.0)
HEMOGLOBIN: 14.6 g/dL (ref 13.0–17.0)
LYMPHS ABS: 1.4 10*3/uL (ref 0.7–4.0)
Lymphocytes Relative: 23 % (ref 12–46)
MCH: 31.3 pg (ref 26.0–34.0)
MCHC: 33.6 g/dL (ref 30.0–36.0)
MCV: 93.1 fL (ref 78.0–100.0)
MONO ABS: 0.9 10*3/uL (ref 0.1–1.0)
MPV: 10.8 fL (ref 8.6–12.4)
Monocytes Relative: 15 % — ABNORMAL HIGH (ref 3–12)
Neutro Abs: 3.3 10*3/uL (ref 1.7–7.7)
Neutrophils Relative %: 55 % (ref 43–77)
Platelets: 173 10*3/uL (ref 150–400)
RBC: 4.66 MIL/uL (ref 4.22–5.81)
RDW: 13.4 % (ref 11.5–15.5)
WBC: 6 10*3/uL (ref 4.0–10.5)

## 2014-11-30 LAB — COMPREHENSIVE METABOLIC PANEL
ALBUMIN: 4 g/dL (ref 3.6–5.1)
ALT: 20 U/L (ref 9–46)
AST: 19 U/L (ref 10–35)
Alkaline Phosphatase: 77 U/L (ref 40–115)
BUN: 18 mg/dL (ref 7–25)
CALCIUM: 9.6 mg/dL (ref 8.6–10.3)
CHLORIDE: 105 mmol/L (ref 98–110)
CO2: 29 mmol/L (ref 20–31)
Creat: 1.01 mg/dL (ref 0.70–1.25)
Glucose, Bld: 90 mg/dL (ref 65–99)
Potassium: 3.8 mmol/L (ref 3.5–5.3)
Sodium: 145 mmol/L (ref 135–146)
TOTAL PROTEIN: 6.1 g/dL (ref 6.1–8.1)
Total Bilirubin: 0.4 mg/dL (ref 0.2–1.2)

## 2014-11-30 LAB — LIPID PANEL
CHOLESTEROL: 120 mg/dL — AB (ref 125–200)
HDL: 33 mg/dL — AB (ref >=40)
LDL CALC: 48 mg/dL (ref <130)
Total CHOL/HDL Ratio: 3.6 Ratio (ref <=5.0)
Triglycerides: 196 mg/dL — ABNORMAL HIGH (ref <150)
VLDL: 39 mg/dL — ABNORMAL HIGH (ref <30)

## 2014-11-30 LAB — TSH: TSH: 3.515 u[IU]/mL (ref 0.350–4.500)

## 2014-12-01 LAB — PSA, MEDICARE: PSA: 0.7 ng/mL (ref <=4.00)

## 2014-12-04 DIAGNOSIS — Z85820 Personal history of malignant melanoma of skin: Secondary | ICD-10-CM | POA: Diagnosis not present

## 2014-12-04 DIAGNOSIS — L821 Other seborrheic keratosis: Secondary | ICD-10-CM | POA: Diagnosis not present

## 2014-12-04 DIAGNOSIS — D1801 Hemangioma of skin and subcutaneous tissue: Secondary | ICD-10-CM | POA: Diagnosis not present

## 2014-12-04 DIAGNOSIS — Z85828 Personal history of other malignant neoplasm of skin: Secondary | ICD-10-CM | POA: Diagnosis not present

## 2014-12-04 DIAGNOSIS — L814 Other melanin hyperpigmentation: Secondary | ICD-10-CM | POA: Diagnosis not present

## 2014-12-04 DIAGNOSIS — L570 Actinic keratosis: Secondary | ICD-10-CM | POA: Diagnosis not present

## 2014-12-20 ENCOUNTER — Ambulatory Visit (INDEPENDENT_AMBULATORY_CARE_PROVIDER_SITE_OTHER): Payer: Medicare Other | Admitting: Physician Assistant

## 2014-12-20 VITALS — BP 120/76 | HR 79 | Temp 97.4°F | Resp 20 | Ht 68.0 in | Wt 214.2 lb

## 2014-12-20 DIAGNOSIS — L03012 Cellulitis of left finger: Secondary | ICD-10-CM

## 2014-12-20 MED ORDER — CEPHALEXIN 500 MG PO CAPS: 500.0000 mg | ORAL_CAPSULE | Freq: Two times a day (BID) | ORAL | Status: AC

## 2014-12-20 MED ORDER — CEPHALEXIN 500 MG PO CAPS: 500.0000 mg | ORAL_CAPSULE | Freq: Two times a day (BID) | ORAL | Status: DC

## 2014-12-20 NOTE — Progress Notes (Signed)
Reginald Edwards  MRN: [id number] DOB: Apr 30, 1945  Subjective:  Pt presents to clinic with area on his left thumb that started as a hang nail but now he has an extra piece of tissue growing and it is getting red and swollen and having local pain.  He has been using OTC topical abx ointment.  Patient Active Problem List   Diagnosis Date Noted  . H/O malignant neoplasm of skin 10/02/2013  . Postural tremor 08/08/2013  . 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 Cervical disc disorder with radiculopathy of cervical region 08/08/2013  . 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 Depression 08/08/2013  . Right foot drop 12/18/2012  . Spinal stenosis of lumbar region 03/20/2012  . Lumbar radiculopathy 03/20/2012  . BETremor 12/06/2011  . Chest discomfort 10/02/2011  . Exertional dyspnea 10/02/2011  . Mood disorder ([place]) 12/23/2010  . Hyperlipidemia 12/23/2010  . Essential hypertension, benign 12/23/2010  . Hearing loss 12/23/2010    Current Outpatient Prescriptions on File Prior to Visit  Medication Sig Dispense Refill  . ALPRAZolam (XANAX) 0.25 MG tablet Take 0.25 mg by mouth at bedtime as needed for anxiety.    Marland Kitchen amphetamine-dextroamphetamine (ADDERALL) 20 MG tablet Take 20 mg by mouth 3 (three) times daily.      Marland Kitchen atorvastatin (LIPITOR) 20 MG tablet TAKE 1 TABLET BY MOUTH EVERY DAY 90 tablet 3  . buPROPion (WELLBUTRIN SR) 200 MG 12 hr tablet Take 200 mg by mouth 3 (three) times daily.    Marland Kitchen losartan-hydrochlorothiazide (HYZAAR) 50-12.5 MG tablet TAKE 1 TABLET BY MOUTH DAILY 90 tablet 3  . Multiple Vitamin (MULTIVITAMIN) tablet Take 1 tablet by mouth daily.    . propranolol ER (INDERAL LA) 80 MG 24 hr capsule Take 1 capsule (80 mg total) by mouth daily. 90 capsule 3  . tamsulosin (FLOMAX) 0.4 MG CAPS capsule Take 0.4 mg by mouth daily.     . temazepam (RESTORIL) 15 MG capsule Take 15 mg by mouth at bedtime as needed for sleep. X 2    . traZODone (DESYREL) 50 MG tablet Take 50 mg by mouth at  bedtime.     No current facility-administered medications on file prior to visit.    Allergies  Allergen Reactions  . Clorazepate     Makes him extremely groggy  . Lisinopril     REACTION: constant cough,irritation  . Vicodin [Hydrocodone-Acetaminophen]     Vomiting, drowsy    Review of Systems  Constitutional: Negative for fever and chills.  Skin: Positive for wound.   Objective:  BP 120/76 mmHg  Pulse 79  Temp(Src) 97.4 F (36.3 C) (Oral)  Resp 20  Ht 5\' 8"  (1.727 m)  Wt 214 lb 3.2 oz (97.16 kg)  BMI 32.58 kg/m2  SpO2 98%  Physical Exam  Constitutional: He is oriented to person, place, and time and well-developed, well-nourished, and in no distress.  HENT:  Head: Normocephalic and atraumatic.  Right Ear: External ear normal.  Left Ear: External ear normal.  Eyes: Conjunctivae are normal.  Neck: Normal range of motion.  Pulmonary/Chest: Effort normal.  Neurological: He is alert and oriented to person, place, and time. Gait normal.  Skin: Skin is warm and dry.  Left thumb medial aspect of distal nail fold - macerated skin with granulation tissue and surrounding erythema -- local anesthesia with 2% lido plain - granulation tissue removed silver nitrate to the area and pressure drsg placed.  Psychiatric: Mood, memory, affect and judgment normal.    Assessment and Plan :  Paronychia, left - Plan: Care order/instruction, Apply dressing, cephALEXin (KEFLEX) 500 MG capsule  Local care with abx use until finished.  If the granulation tissue returns he will recheck to have it removed.  Windell Hummingbird PA-C  [facility] 12/20/2014 8:50 AM

## 2014-12-20 NOTE — Patient Instructions (Signed)
Ok to continue to use antibiotic ointment. Please take all of your antibiotics. Let me know if that flap of tissue regrows.

## 2015-01-20 ENCOUNTER — Emergency Department (HOSPITAL_COMMUNITY): Payer: Medicare Other

## 2015-01-20 ENCOUNTER — Observation Stay (HOSPITAL_COMMUNITY)
Admission: EM | Admit: 2015-01-20 | Discharge: 2015-01-22 | Disposition: A | Discharge status: Home / Self Care | Payer: Medicare Other | Attending: Internal Medicine | Admitting: Internal Medicine

## 2015-01-20 ENCOUNTER — Encounter (HOSPITAL_COMMUNITY): Payer: Self-pay | Admitting: *Deleted

## 2015-01-20 DIAGNOSIS — I10 Essential (primary) hypertension: Secondary | ICD-10-CM | POA: Diagnosis not present

## 2015-01-20 DIAGNOSIS — E785 Hyperlipidemia, unspecified: Secondary | ICD-10-CM | POA: Diagnosis not present

## 2015-01-20 DIAGNOSIS — M1990 Unspecified osteoarthritis, unspecified site: Secondary | ICD-10-CM | POA: Insufficient documentation

## 2015-01-20 DIAGNOSIS — M4806 Spinal stenosis, lumbar region: Secondary | ICD-10-CM | POA: Insufficient documentation

## 2015-01-20 DIAGNOSIS — R531 Weakness: Secondary | ICD-10-CM | POA: Diagnosis present

## 2015-01-20 DIAGNOSIS — Z8673 Personal history of transient ischemic attack (TIA), and cerebral infarction without residual deficits: Secondary | ICD-10-CM | POA: Diagnosis not present

## 2015-01-20 DIAGNOSIS — M5416 Radiculopathy, lumbar region: Secondary | ICD-10-CM | POA: Insufficient documentation

## 2015-01-20 DIAGNOSIS — Z79899 Other long term (current) drug therapy: Secondary | ICD-10-CM | POA: Diagnosis not present

## 2015-01-20 DIAGNOSIS — M6289 Other specified disorders of muscle: Secondary | ICD-10-CM

## 2015-01-20 DIAGNOSIS — N400 Enlarged prostate without lower urinary tract symptoms: Secondary | ICD-10-CM | POA: Diagnosis not present

## 2015-01-20 DIAGNOSIS — R413 Other amnesia: Secondary | ICD-10-CM | POA: Diagnosis not present

## 2015-01-20 DIAGNOSIS — F419 Anxiety disorder, unspecified: Secondary | ICD-10-CM | POA: Insufficient documentation

## 2015-01-20 DIAGNOSIS — M21371 Foot drop, right foot: Secondary | ICD-10-CM | POA: Diagnosis present

## 2015-01-20 DIAGNOSIS — Z85828 Personal history of other malignant neoplasm of skin: Secondary | ICD-10-CM | POA: Diagnosis not present

## 2015-01-20 DIAGNOSIS — G459 Transient cerebral ischemic attack, unspecified: Principal | ICD-10-CM | POA: Diagnosis present

## 2015-01-20 DIAGNOSIS — N289 Disorder of kidney and ureter, unspecified: Secondary | ICD-10-CM | POA: Insufficient documentation

## 2015-01-20 DIAGNOSIS — H9190 Unspecified hearing loss, unspecified ear: Secondary | ICD-10-CM | POA: Diagnosis not present

## 2015-01-20 DIAGNOSIS — R471 Dysarthria and anarthria: Secondary | ICD-10-CM | POA: Diagnosis present

## 2015-01-20 DIAGNOSIS — F329 Major depressive disorder, single episode, unspecified: Secondary | ICD-10-CM | POA: Insufficient documentation

## 2015-01-20 DIAGNOSIS — R404 Transient alteration of awareness: Secondary | ICD-10-CM | POA: Diagnosis not present

## 2015-01-20 DIAGNOSIS — I639 Cerebral infarction, unspecified: Secondary | ICD-10-CM | POA: Diagnosis not present

## 2015-01-20 DIAGNOSIS — R4781 Slurred speech: Secondary | ICD-10-CM | POA: Insufficient documentation

## 2015-01-20 HISTORY — DX: Transient cerebral ischemic attack, unspecified: G45.9

## 2015-01-20 LAB — COMPREHENSIVE METABOLIC PANEL
ALBUMIN: 3.8 g/dL (ref 3.5–5.0)
ALT: 21 U/L (ref 17–63)
ANION GAP: 11 (ref 5–15)
AST: 24 U/L (ref 15–41)
Alkaline Phosphatase: 70 U/L (ref 38–126)
BUN: 18 mg/dL (ref 6–20)
CHLORIDE: 101 mmol/L (ref 101–111)
CO2: 28 mmol/L (ref 22–32)
CREATININE: 1.53 mg/dL — AB (ref 0.61–1.24)
Calcium: 10.5 mg/dL — ABNORMAL HIGH (ref 8.9–10.3)
GFR calc Af Amer: 53 mL/min — ABNORMAL LOW (ref >60)
GFR, EST NON AFRICAN AMERICAN: 45 mL/min — AB (ref >60)
Glucose, Bld: 142 mg/dL — ABNORMAL HIGH (ref 65–99)
Potassium: 3.9 mmol/L (ref 3.5–5.1)
Sodium: 140 mmol/L (ref 135–145)
Total Bilirubin: 0.6 mg/dL (ref 0.3–1.2)
Total Protein: 6.1 g/dL — ABNORMAL LOW (ref 6.5–8.1)

## 2015-01-20 LAB — PROTIME-INR
INR: 1.09 (ref 0.00–1.49)
PROTHROMBIN TIME: 14.3 s (ref 11.6–15.2)

## 2015-01-20 LAB — DIFFERENTIAL
BASOS ABS: 0 10*3/uL (ref 0.0–0.1)
BASOS PCT: 0 %
EOS ABS: 0.5 10*3/uL (ref 0.0–0.7)
EOS PCT: 5 %
Lymphocytes Relative: 16 %
Lymphs Abs: 1.4 10*3/uL (ref 0.7–4.0)
Monocytes Absolute: 0.6 10*3/uL (ref 0.1–1.0)
Monocytes Relative: 7 %
NEUTROS PCT: 72 %
Neutro Abs: 6.4 10*3/uL (ref 1.7–7.7)

## 2015-01-20 LAB — CBC
HCT: 43.1 % (ref 39.0–52.0)
Hemoglobin: 14.7 g/dL (ref 13.0–17.0)
MCH: 31.7 pg (ref 26.0–34.0)
MCHC: 34.1 g/dL (ref 30.0–36.0)
MCV: 93.1 fL (ref 78.0–100.0)
Platelets: 172 10*3/uL (ref 150–400)
RBC: 4.63 MIL/uL (ref 4.22–5.81)
RDW: 13.3 % (ref 11.5–15.5)
WBC: 8.9 10*3/uL (ref 4.0–10.5)

## 2015-01-20 LAB — I-STAT CHEM 8, ED
BUN: 21 mg/dL — ABNORMAL HIGH (ref 6–20)
Calcium, Ion: 1.32 mmol/L — ABNORMAL HIGH (ref 1.13–1.30)
Chloride: 99 mmol/L — ABNORMAL LOW (ref 101–111)
Creatinine, Ser: 1.5 mg/dL — ABNORMAL HIGH (ref 0.61–1.24)
Glucose, Bld: 140 mg/dL — ABNORMAL HIGH (ref 65–99)
HEMATOCRIT: 45 % (ref 39.0–52.0)
HEMOGLOBIN: 15.3 g/dL (ref 13.0–17.0)
POTASSIUM: 3.6 mmol/L (ref 3.5–5.1)
SODIUM: 144 mmol/L (ref 135–145)
TCO2: 30 mmol/L (ref 0–100)

## 2015-01-20 LAB — I-STAT TROPONIN, ED: Troponin i, poc: 0.01 ng/mL (ref 0.00–0.08)

## 2015-01-20 LAB — ETHANOL

## 2015-01-20 LAB — APTT: APTT: 28 s (ref 24–37)

## 2015-01-20 NOTE — ED Notes (Signed)
Neurologist and EDP at the bedside for evaluation at this time

## 2015-01-20 NOTE — H&P (Signed)
[facility] History and Physical  Reginald Edwards [id number] DOB: 07/17/1945 DOA: 01/20/2015  Referring physician: ED PCP: Leandrew Koyanagi, MD   Chief Complaint: right sided weakness with slurred speech  HPI:  Reginald Edwards is a 70 year old right-hand dominant male with a past medical history for HTN, depression, spinal stenosis, gait abnormality, anxiety, HLD, and previous diagnosis refuted of Parkinson's and MS; who presents with complaints of difficulties speaking and weakness on his right hand. His mother helps provide history she states there is tenderness night around 7 PM. She initially noticed that he was having difficulty utilizing his fork to eat his dinner.  She knows that he was leaning toward his right side and when she asked him a question and he had difficulty formulating his words speech was slurred. She reported weakness of the right arm and leg. She was unsure if he had any facial weakness as he would not smile for her.   His mother states that he's previously been diagnosed with multiple sclerosis and later Parkinson's disease, but both diagnoses were refuted by physicians upon further evaluation. This point they are unsure of why he has bouts of confusion, decreased memory, and what they notice as apraxia.  Upon admission into the emergency department symptoms had self resolved. He was evaluated with a CT scan of the head showed no acute abnormalities.    Review of Systems  Constitutional: Negative for chills and weight loss.  HENT: Positive for hearing loss. Negative for tinnitus.   Eyes: Positive for redness. Negative for pain.  Respiratory: Negative for hemoptysis and sputum production.   Cardiovascular: Negative for palpitations and orthopnea.  Gastrointestinal: Negative for abdominal pain and diarrhea.  Genitourinary: Negative for urgency and frequency.  Musculoskeletal: Positive for joint pain.  Skin: Negative for itching and rash.  Neurological:  Positive for speech change and focal weakness. Negative for loss of consciousness.  Endo/Heme/Allergies: Negative for environmental allergies and polydipsia.  Psychiatric/Behavioral: Negative for hallucinations and substance abuse.     Past Medical History  Diagnosis Date  . Hyperlipemia   . Essential hypertension   . Mood disorder ([place])   . Hearing loss   . Arthritis   . Neuromuscular disorder ([place])   . Depression   . Anxiety   . DJD (degenerative joint disease) of lumbar spine   . Cancer ([place])     skin   . TIA (transient ischemic attack)      Past Surgical History  Procedure Laterality Date  . Eye surgery    . Tonsillectomy    . Shoulder surgery        Social History:  reports that he has never smoked. He has never used smokeless tobacco. He reports that he does not drink alcohol or use illicit drugs.   Allergies  Allergen Reactions  . Clorazepate     Makes him extremely groggy  . Lisinopril     REACTION: constant cough,irritation  . Vicodin [Hydrocodone-Acetaminophen]     Vomiting, drowsy, hallucinations    Family History  Problem Relation Age of Onset  . Cancer Mother   . Cancer Father     lung - because of smoking  . Diabetes Brother     type 2  . Cancer Brother   . Heart disease Brother   . Hypertension Brother   . Colon cancer Neg Hx   . Esophageal cancer Neg Hx   . Pancreatic cancer Neg Hx   . Prostate cancer Neg Hx   . Rectal cancer Neg  Hx   . Stomach cancer Neg Hx   . Cancer Brother   . Heart disease Brother   . Hypertension Brother        Prior to Admission medications   Medication Sig Start Date End Date Taking? Authorizing Provider  ALPRAZolam (XANAX) 0.25 MG tablet Take 0.25 mg by mouth 3 (three) times daily.    Yes Historical Provider, MD  amphetamine-dextroamphetamine (ADDERALL) 20 MG tablet Take 20 mg by mouth 3 (three) times daily.     Yes Historical Provider, MD  aspirin 325 MG tablet Take 325 mg by mouth daily.   Yes  Historical Provider, MD  atorvastatin (LIPITOR) 20 MG tablet TAKE 1 TABLET BY MOUTH EVERY DAY Patient taking differently: Take 20 mg by mouth daily at 6 PM. TAKE 1 TABLET BY MOUTH EVERY DAY 11/25/14  Yes Leandrew Koyanagi, MD  buPROPion [facility] SR) 200 MG 12 hr tablet Take 200 mg by mouth 3 (three) times daily.   Yes Historical Provider, MD  calcium carbonate (TUMS - DOSED IN MG ELEMENTAL CALCIUM) 500 MG chewable tablet Chew 1 tablet by mouth 3 (three) times daily.   Yes Historical Provider, MD  diclofenac sodium (VOLTAREN) 1 % GEL Apply 2 g topically daily as needed (pain).   Yes Historical Provider, MD  losartan-hydrochlorothiazide (HYZAAR) 50-12.5 MG tablet TAKE 1 TABLET BY MOUTH DAILY Patient taking differently: Take 1 tablet by mouth daily. TAKE 1 TABLET BY MOUTH DAILY 11/25/14  Yes Leandrew Koyanagi, MD  Multiple Vitamin (MULTIVITAMIN) tablet Take 1 tablet by mouth daily.   Yes Historical Provider, MD  tamsulosin (FLOMAX) 0.4 MG CAPS capsule Take 0.4 mg by mouth daily.    Yes Historical Provider, MD  temazepam (RESTORIL) 15 MG capsule Take 15 mg by mouth at bedtime as needed for sleep. X 2   Yes Historical Provider, MD  traZODone (DESYREL) 50 MG tablet Take 50 mg by mouth at bedtime.   Yes Historical Provider, MD  propranolol ER (INDERAL LA) 80 MG 24 hr capsule Take 1 capsule (80 mg total) by mouth daily. Patient not taking: Reported on 01/20/2015 11/25/14   Leandrew Koyanagi, MD     Physical Exam: Filed Vitals:   01/20/15 2030 01/20/15 2140 01/20/15 2143 01/20/15 2145  BP: 126/74 131/82  128/88  Pulse: 61 57  56  Temp:   97.9 F (36.6 C)   TempSrc:      Resp: 22 17  21   SpO2:  95%  94%     Constitutional: Vital signs reviewed. Patient is a well-developed and well-nourished in no acute distress and cooperative with exam. Alert and oriented x3.  Head: Normocephalic and atraumatic  Ear: TM normal bilaterally  Mouth: no erythema or exudates, MMM  Eyes: PERRL, EOMI,  conjunctival injection, possible yellowish-brown ringing around eyes Neck: Supple, Trachea midline normal ROM, No JVD, mass, thyromegaly, or carotid bruit present.  Cardiovascular: RRR, S1 normal, S2 normal, no MRG, pulses symmetric and intact bilaterally  Pulmonary/Chest: CTAB, no wheezes, rales, or rhonchi  Abdominal: Soft. Non-tender, non-distended, bowel sounds are normal, no masses, organomegaly, or guarding present.  GU: no CVA tenderness Musculoskeletal: Scoliosis of the back with significant muscle atrophy of bilateral lower extremities  Ext: +1 edema lower extremity Hematology: no cervical, inginal, or axillary adenopathy.  Neurological: A&O x3, Strenght is normal and symmetric bilaterally, cranial nerve II-XII are grossly intact, no focal motor deficit, sensory intact to light touch bilaterally.  Skin: Warm, dry and intact. No rash, cyanosis, or clubbing.  Psychiatric: Normal mood and affect. speech and behavior is normal. Judgment and thought content normal. Cognition and memory are normal.      Data Review   Micro Results No results found for this or any previous visit (from the past 240 hour(s)).  Radiology Reports Ct Head Wo Contrast  01/20/2015  CLINICAL DATA:  Aphasia.  Code stroke. EXAM: CT HEAD WITHOUT CONTRAST TECHNIQUE: Contiguous axial images were obtained from the base of the skull through the vertex without intravenous contrast. COMPARISON:  MRI brain 01/08/2013 FINDINGS: There is no evidence acute intracranial hemorrhage, mass lesion, brain edema or extra-axial fluid collection. There is extensive low density throughout the periventricular and subcortical white matter bilaterally which appears similar to previous MRI. There is involvement of the basal ganglia bilaterally. There is mild generalized atrophy. The visualized paranasal sinuses, mastoid air cells and middle ears are clear. The calvarium is intact. IMPRESSION: No evidence of acute intracranial hemorrhage or  obvious change in extensive chronic small vessel ischemic changes. These results were called by telephone at the time of interpretation on 01/20/2015 at 8:30 pm to Dr. Nicole Kindred, who verbally acknowledged these results. Electronically Signed   By: Richardean Sale M.D.   On: 01/20/2015 20:31   Mr Angiogram Head Wo Contrast  01/20/2015  CLINICAL DATA:  Acute onset speech difficulty, weakness. History of hypertension, hyperlipidemia, cancer. On daily aspirin. EXAM: MRI HEAD WITHOUT CONTRAST MRA HEAD WITHOUT CONTRAST TECHNIQUE: Multiplanar, multiecho pulse sequences of the brain and surrounding structures were obtained without intravenous contrast. Angiographic images of the head were obtained using MRA technique without contrast. COMPARISON:  CT head January 19, 2014 at 2013 hours and MRI of the brain January 08, 2013 FINDINGS: MRI HEAD FINDINGS No reduced diffusion to suggest acute ischemia. Punctate focus of sub susceptibility artifact LEFT temporal lobe, nonspecific. Ventricles and sulci are normal for patient's age. Confluent supratentorial white matter FLAIR T2 hyperintensities, with patchy T2 hyperintense signal within the basal ganglia. No midline shift, mass effect or mass lesions. No abnormal extra-axial fluid collections. Ocular globes and orbital contents are normal. Imaged paranasal sinuses are well-aerated. Mastoid air cells are well aerated. MRA HEAD FINDINGS Anterior circulation: Normal flow related enhancement of the included cervical, petrous, cavernous and supraclinoid internal carotid arteries. LEFT ophthalmic artery infundibulum. Patent anterior communicating artery. Normal flow related enhancement of the anterior and middle cerebral arteries, including distal segments. Supernumerary anterior cerebral artery arising from RIGHT A1-2 junction. No large vessel occlusion, high-grade stenosis, abnormal luminal irregularity, aneurysm. Posterior circulation: Codominant vertebral arteries. Basilar artery is  patent, with normal flow related enhancement of the main branch vessels. Tiny bilateral posterior communicating arteries present. Normal flow related enhancement of the posterior cerebral arteries. No large vessel occlusion, high-grade stenosis, abnormal luminal irregularity, aneurysm. IMPRESSION: MRI HEAD: No acute intracranial process, specifically no acute ischemia. Moderate to severe white matter changes in a pattern most compatible with chronic small vessel ischemic disease. Bilateral basal ganglia edema suggesting sequelae of hypertension/PRES, metabolic disorder. MRA HEAD: Negative. Electronically Signed   By: Elon Alas M.D.   On: 01/20/2015 21:50   Mr Brain Wo Contrast  01/20/2015  CLINICAL DATA:  Acute onset speech difficulty, weakness. History of hypertension, hyperlipidemia, cancer. On daily aspirin. EXAM: MRI HEAD WITHOUT CONTRAST MRA HEAD WITHOUT CONTRAST TECHNIQUE: Multiplanar, multiecho pulse sequences of the brain and surrounding structures were obtained without intravenous contrast. Angiographic images of the head were obtained using MRA technique without contrast. COMPARISON:  CT head January 19, 2014 at 2013 hours  and MRI of the brain January 08, 2013 FINDINGS: MRI HEAD FINDINGS No reduced diffusion to suggest acute ischemia. Punctate focus of sub susceptibility artifact LEFT temporal lobe, nonspecific. Ventricles and sulci are normal for patient's age. Confluent supratentorial white matter FLAIR T2 hyperintensities, with patchy T2 hyperintense signal within the basal ganglia. No midline shift, mass effect or mass lesions. No abnormal extra-axial fluid collections. Ocular globes and orbital contents are normal. Imaged paranasal sinuses are well-aerated. Mastoid air cells are well aerated. MRA HEAD FINDINGS Anterior circulation: Normal flow related enhancement of the included cervical, petrous, cavernous and supraclinoid internal carotid arteries. LEFT ophthalmic artery infundibulum.  Patent anterior communicating artery. Normal flow related enhancement of the anterior and middle cerebral arteries, including distal segments. Supernumerary anterior cerebral artery arising from RIGHT A1-2 junction. No large vessel occlusion, high-grade stenosis, abnormal luminal irregularity, aneurysm. Posterior circulation: Codominant vertebral arteries. Basilar artery is patent, with normal flow related enhancement of the main branch vessels. Tiny bilateral posterior communicating arteries present. Normal flow related enhancement of the posterior cerebral arteries. No large vessel occlusion, high-grade stenosis, abnormal luminal irregularity, aneurysm. IMPRESSION: MRI HEAD: No acute intracranial process, specifically no acute ischemia. Moderate to severe white matter changes in a pattern most compatible with chronic small vessel ischemic disease. Bilateral basal ganglia edema suggesting sequelae of hypertension/PRES, metabolic disorder. MRA HEAD: Negative. Electronically Signed   By: Elon Alas M.D.   On: 01/20/2015 21:50     CBC  Recent Labs Lab 01/20/15 2011 01/20/15 2017  WBC  --  8.9  HGB 15.3 14.7  HCT 45.0 43.1  PLT  --  172  MCV  --  93.1  MCH  --  31.7  MCHC  --  34.1  RDW  --  13.3  LYMPHSABS  --  1.4  MONOABS  --  0.6  EOSABS  --  0.5  BASOSABS  --  0.0    Chemistries   Recent Labs Lab 01/20/15 2011 01/20/15 2017  NA 144 140  K 3.6 3.9  CL 99* 101  CO2  --  28  GLUCOSE 140* 142*  BUN 21* 18  CREATININE 1.50* 1.53*  CALCIUM  --  10.5*  AST  --  24  ALT  --  21  ALKPHOS  --  70  BILITOT  --  0.6   ------------------------------------------------------------------------------------------------------------------ CrCl cannot be calculated (Unknown ideal weight.). ------------------------------------------------------------------------------------------------------------------ No results for input(s): HGBA1C in the last 72  hours. ------------------------------------------------------------------------------------------------------------------ No results for input(s): CHOL, HDL, LDLCALC, TRIG, CHOLHDL, LDLDIRECT in the last 72 hours. ------------------------------------------------------------------------------------------------------------------ No results for input(s): TSH, T4TOTAL, T3FREE, THYROIDAB in the last 72 hours.  Invalid input(s): FREET3 ------------------------------------------------------------------------------------------------------------------ No results for input(s): VITAMINB12, FOLATE, FERRITIN, TIBC, IRON, RETICCTPCT in the last 72 hours.  Coagulation profile  Recent Labs Lab 01/20/15 2017  INR 1.09    No results for input(s): DDIMER in the last 72 hours.  Cardiac Enzymes No results for input(s): CKMB, TROPONINI, MYOGLOBIN in the last 168 hours.  Invalid input(s): CK ------------------------------------------------------------------------------------------------------------------ Invalid input(s): POCBNP   CBG: No results for input(s): GLUCAP in the last 168 hours.     EKG: Independently reviewed. Sinus rhythm Short PR interval  And Incomplete left  bundle branch block    Assessment/Plan Principal Problem: TIA (transient ischemic attack): Symptoms of right-sided weakness and dysarthria resolved prior to patient arriving to the hospital. CT scan showing no acute abnormality. Neurology consulted and ED.  - Admit to telemetry bed - Follow up neurology recommendations - Carotid Dopplers -  MRI/MRA without contrast - PT/OT/Sp to eval and treat - Social work  Right sided weakness: Now resolved. - Physical therapy to eval and treat  Dysarthria: Now resolved. - Speech therapy to eval and treat  Hearing loss: Chronic  Right foot drop: Chronic. Patient has a orthotic device to help with this.  Previous diagnosis is include Parkinson's disease and MS which were  refuted by specialist. 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 With the basal ganglia swelling noted on MRI this is also noted to be seen in Wilson's disease which can mimic Parkinson's disease and MS. Although patient does not have any overt signs of liver disease.  - Check ceruplasmin level in a.m.   Code Status:   full Family Communication: bedside Disposition Plan: admit   Total time spent 55 minutes.Greater than 50% of this time was spent in counseling, explanation of diagnosis, planning of further management, and coordination of care  [facility] Pager [phone]  If 7PM-7AM, please contact night-coverage [email or web address] Password [email or web address] 01/20/2015, 10:18 PM   \

## 2015-01-20 NOTE — ED Notes (Signed)
Patient transported to MRI 

## 2015-01-20 NOTE — ED Notes (Signed)
Pt arrives via [facility]. Per report, the pt wife stated the pt was eating dinner and she noticed he was staring at his food on his fork, dropped his fork and then started mumbling. Per medic, he was still mumbling on arrival to scene, but since then has been gradually improving and seems to be back to normal now. Pt reports that he started to feel weak all over, "in both shoulders" and in the right leg just prior to arrival. VSS en route.

## 2015-01-20 NOTE — Consult Note (Signed)
Admission H&P    Chief Complaint: transient weakness and speech difficulty.  HPI: Reginald Edwards is an 70 y.o. male with a history of hypertension, hyperlipidemia, depression and anxiety as well as an equivocal history of demyelinating disorder, brought to the emergency room and code stroke status following acute onset of difficulty and weakness. He had difficulty formulating words and his speech was slurred. He had diffuse weakness, but proportionate weakness involving his right leg. No facial droop was described. There is an equivocal history of TIAs as well. CT scan of his head showed no acute intracranial abnormality. Chronic small vessel ischemic changes were noted. Patient has been taking aspirin daily. Deficits resolved essentially in route to the ED. NIH stroke score was 0 at the time of this evaluation.  LSN: 7:15 PM on 01/20/2015 tPA Given: No: episodes resolved mRankin:  Past Medical History  Diagnosis Date  . Hyperlipemia   . Essential hypertension   . Mood disorder ([place])   . Hearing loss   . Arthritis   . Neuromuscular disorder ([place])   . Depression   . Anxiety   . DJD (degenerative joint disease) of lumbar spine   . Cancer ([place])     skin   . TIA (transient ischemic attack)     Past Surgical History  Procedure Laterality Date  . Eye surgery    . Tonsillectomy    . Shoulder surgery      Family History  Problem Relation Age of Onset  . Cancer Mother   . Cancer Father     lung - because of smoking  . Diabetes Brother     type 2  . Cancer Brother   . Heart disease Brother   . Hypertension Brother   . Colon cancer Neg Hx   . Esophageal cancer Neg Hx   . Pancreatic cancer Neg Hx   . Prostate cancer Neg Hx   . Rectal cancer Neg Hx   . Stomach cancer Neg Hx   . Cancer Brother   . Heart disease Brother   . Hypertension Brother    Social History:  reports that he has never smoked. He has never used smokeless tobacco. He reports that he does not drink alcohol or  use illicit drugs.  Allergies:  Allergies  Allergen Reactions  . Clorazepate     Makes him extremely groggy  . Lisinopril     REACTION: constant cough,irritation  . Vicodin [Hydrocodone-Acetaminophen]     Vomiting, drowsy    Medications: Patient's preadmission medications were reviewed by me.  ROS: History obtained from the patient  General ROS: negative for - chills, fatigue, fever, night sweats, weight gain or weight loss Psychological ROS: negative for - behavioral disorder, hallucinations, memory difficulties, mood swings or suicidal ideation Ophthalmic ROS: negative for - blurry vision, double vision, eye pain or loss of vision ENT ROS: negative for - epistaxis, nasal discharge, oral lesions, sore throat, tinnitus or vertigo Allergy and Immunology ROS: negative for - hives or itchy/watery eyes Hematological and Lymphatic ROS: negative for - bleeding problems, bruising or swollen lymph nodes Endocrine ROS: negative for - galactorrhea, hair pattern changes, polydipsia/polyuria or temperature intolerance Respiratory ROS: negative for - cough, hemoptysis, shortness of breath or wheezing Cardiovascular ROS: negative for - chest pain, dyspnea on exertion, edema or irregular heartbeat Gastrointestinal ROS: negative for - abdominal pain, diarrhea, hematemesis, nausea/vomiting or stool incontinence Genito-Urinary ROS: negative for - dysuria, hematuria, incontinence or urinary frequency/urgency Musculoskeletal ROS: negative for - joint swelling or muscular  weakness Neurological ROS: as noted in HPI Dermatological ROS: negative for rash and skin lesion changes  Physical Examination: Blood pressure 126/74, pulse 61, temperature 97.6 F (36.4 C), temperature source Oral, resp. rate 22, SpO2 96 %.  HEENT-  Normocephalic, no lesions, without obvious abnormality.  Normal external eye and conjunctiva.  Normal TM's bilaterally.  Normal auditory canals and external ears. Normal external nose,  mucus membranes and septum.  Normal pharynx. Neck supple with no masses, nodes, nodules or enlargement. Cardiovascular - regular rate and rhythm, S1, S2 normal, no murmur, click, rub or gallop Lungs - chest clear, no wheezing, rales, normal symmetric air entry Abdomen - soft, non-tender; bowel sounds normal; no masses,  no organomegaly Extremities - no joint deformities, effusion, or inflammation and no edema  Neurologic Examination: Mental Status: Alert, oriented, thought content appropriate.  Speech fluent without evidence of aphasia. Able to follow commands without difficulty. Cranial Nerves: II-Visual fields were normal. III/IV/VI-Pupils were equal and reacted normally to light. Extraocular movements were full and conjugate.    V/VII-no facial numbness and no facial weakness. VIII-normal. X-normal speech and symmetrical palatal movement. XI: trapezius strength/neck flexion strength normal bilaterally XII-midline tongue extension with normal strength. Motor: 5/5 bilaterally with normal tone and bulk Sensory: Normal throughout. Deep Tendon Reflexes: absent. Plantars: mute bilaterally Cerebellar: Normal finger-to-nose testing. Carotid auscultation: Normal  Results for orders placed or performed during the hospital encounter of 01/20/15 (from the past 48 hour(s))  I-stat troponin, ED (not at [facility], [facility])     Status: None   Collection Time: 01/20/15  8:10 PM  Result Value Ref Range   Troponin i, poc 0.01 0.00 - 0.08 ng/mL   Comment 3            Comment: Due to the release kinetics of cTnI, a negative result within the first hours of the onset of symptoms does not rule out myocardial infarction with certainty. If myocardial infarction is still suspected, repeat the test at appropriate intervals.   I-Stat Chem 8, ED  (not at [facility], [facility])     Status: Abnormal   Collection Time: 01/20/15  8:11 PM  Result Value Ref Range   Sodium 144 135 - 145 mmol/L   Potassium 3.6 3.5 - 5.1 mmol/L    Chloride 99 (L) 101 - 111 mmol/L   BUN 21 (H) 6 - 20 mg/dL   Creatinine, Ser 1.50 (H) 0.61 - 1.24 mg/dL   Glucose, Bld 140 (H) 65 - 99 mg/dL   Calcium, Ion 1.32 (H) 1.13 - 1.30 mmol/L   TCO2 30 0 - 100 mmol/L   Hemoglobin 15.3 13.0 - 17.0 g/dL   HCT 45.0 39.0 - 52.0 %  Protime-INR     Status: None   Collection Time: 01/20/15  8:17 PM  Result Value Ref Range   Prothrombin Time 14.3 11.6 - 15.2 seconds   INR 1.09 0.00 - 1.49  APTT     Status: None   Collection Time: 01/20/15  8:17 PM  Result Value Ref Range   aPTT 28 24 - 37 seconds  CBC     Status: None   Collection Time: 01/20/15  8:17 PM  Result Value Ref Range   WBC 8.9 4.0 - 10.5 K/uL   RBC 4.63 4.22 - 5.81 MIL/uL   Hemoglobin 14.7 13.0 - 17.0 g/dL   HCT 43.1 39.0 - 52.0 %   MCV 93.1 78.0 - 100.0 fL   MCH 31.7 26.0 - 34.0 pg   MCHC 34.1 30.0 -  36.0 g/dL   RDW 13.3 11.5 - 15.5 %   Platelets 172 150 - 400 K/uL  Differential     Status: None   Collection Time: 01/20/15  8:17 PM  Result Value Ref Range   Neutrophils Relative % 72 %   Neutro Abs 6.4 1.7 - 7.7 K/uL   Lymphocytes Relative 16 %   Lymphs Abs 1.4 0.7 - 4.0 K/uL   Monocytes Relative 7 %   Monocytes Absolute 0.6 0.1 - 1.0 K/uL   Eosinophils Relative 5 %   Eosinophils Absolute 0.5 0.0 - 0.7 K/uL   Basophils Relative 0 %   Basophils Absolute 0.0 0.0 - 0.1 K/uL   Ct Head Wo Contrast  01/20/2015  CLINICAL DATA:  Aphasia.  Code stroke. EXAM: CT HEAD WITHOUT CONTRAST TECHNIQUE: Contiguous axial images were obtained from the base of the skull through the vertex without intravenous contrast. COMPARISON:  MRI brain 01/08/2013 FINDINGS: There is no evidence acute intracranial hemorrhage, mass lesion, brain edema or extra-axial fluid collection. There is extensive low density throughout the periventricular and subcortical white matter bilaterally which appears similar to previous MRI. There is involvement of the basal ganglia bilaterally. There is mild generalized atrophy.  The visualized paranasal sinuses, mastoid air cells and middle ears are clear. The calvarium is intact. IMPRESSION: No evidence of acute intracranial hemorrhage or obvious change in extensive chronic small vessel ischemic changes. These results were called by telephone at the time of interpretation on 01/20/2015 at 8:30 pm to Dr. Nicole Kindred, who verbally acknowledged these results. Electronically Signed   By: Richardean Sale M.D.   On: 01/20/2015 20:31    Assessment: 70 y.o. male with multiple risk factors for stroke presenting with probable left subcortical MCA territory TIA. An acute small vessel subcortical ischemic stroke cannot be ruled out at this point, however.  Stroke Risk Factors - hyperlipidemia and hypertension  Plan: 1. HgbA1c, fasting lipid panel 2. MRI, MRA  of the brain without contrast 3. PT consult, OT consult, Speech consult 4. Echocardiogram 5. Carotid dopplers 6. Prophylactic therapy-Antiplatelet med: Aspirin  8. Telemetry monitoring  C.R. Nicole Kindred, MD [facility] [phone]  01/20/2015, 8:45 PM

## 2015-01-20 NOTE — ED Provider Notes (Signed)
CSN: [id number]     Arrival date & time 01/20/15  2003 History   First MD Initiated Contact with Patient 01/20/15 2007     Chief complaint: Code stroke  (Consider location/radiation/quality/duration/timing/severity/associated sxs/prior Treatment) The history is provided by the patient.   70 year old male was asked to normal at 6:30 PM. He noted he was having some difficulty with his speech and states that he felt weak all over-arms where the legs. EMS reports he was having difficulty speaking but improved on the way to the ED. Patient states that his speech is not quite back to normal. He has a questionable history of multiple sclerosis. He denies headache, nausea, vomiting.  Past Medical History  Diagnosis Date  . Hyperlipemia   . Essential hypertension   . Mood disorder ([place])   . Hearing loss   . Arthritis   . Neuromuscular disorder ([place])   . Depression   . Anxiety   . DJD (degenerative joint disease) of lumbar spine   . Cancer [facility])     skin    Past Surgical History  Procedure Laterality Date  . Eye surgery    . Tonsillectomy    . Shoulder surgery     Family History  Problem Relation Age of Onset  . Cancer Mother   . Cancer Father     lung - because of smoking  . Diabetes Brother     type 2  . Cancer Brother   . Heart disease Brother   . Hypertension Brother   . Colon cancer Neg Hx   . Esophageal cancer Neg Hx   . Pancreatic cancer Neg Hx   . Prostate cancer Neg Hx   . Rectal cancer Neg Hx   . Stomach cancer Neg Hx   . Cancer Brother   . Heart disease Brother   . Hypertension Brother    Social History  Substance Use Topics  . Smoking status: Never Smoker   . Smokeless tobacco: Never Used  . Alcohol Use: No    Review of Systems  All other systems reviewed and are negative.     Allergies  Clorazepate; Lisinopril; and Vicodin  Home Medications   Prior to Admission medications   Medication Sig Start Date End Date Taking? Authorizing Provider   ALPRAZolam Duanne Moron) 0.25 MG tablet Take 0.25 mg by mouth at bedtime as needed for anxiety.    Historical Provider, MD  amphetamine-dextroamphetamine (ADDERALL) 20 MG tablet Take 20 mg by mouth 3 (three) times daily.      Historical Provider, MD  atorvastatin (LIPITOR) 20 MG tablet TAKE 1 TABLET BY MOUTH EVERY DAY 11/25/14   Leandrew Koyanagi, MD  buPROPion [facility] SR) 200 MG 12 hr tablet Take 200 mg by mouth 3 (three) times daily.    Historical Provider, MD  losartan-hydrochlorothiazide (HYZAAR) 50-12.5 MG tablet TAKE 1 TABLET BY MOUTH DAILY 11/25/14   Leandrew Koyanagi, MD  Multiple Vitamin (MULTIVITAMIN) tablet Take 1 tablet by mouth daily.    Historical Provider, MD  propranolol ER (INDERAL LA) 80 MG 24 hr capsule Take 1 capsule (80 mg total) by mouth daily. 11/25/14   Leandrew Koyanagi, MD  tamsulosin (FLOMAX) 0.4 MG CAPS capsule Take 0.4 mg by mouth daily.     Historical Provider, MD  temazepam (RESTORIL) 15 MG capsule Take 15 mg by mouth at bedtime as needed for sleep. X 2    Historical Provider, MD  traZODone (DESYREL) 50 MG tablet Take 50 mg by mouth at bedtime.  Historical Provider, MD   BP 126/74 mmHg  Pulse 61  Temp(Src) 97.6 F (36.4 C) (Oral)  Resp 22  SpO2 85% Physical Exam  Nursing note and vitals reviewed.  70 year old male, resting comfortably and in no acute distress. Vital signs are significant for mild tachypnea. Oxygen saturation is 96%, which is normal. Head is normocephalic and atraumatic. PERRLA, EOMI. Oropharynx is clear. Neck is nontender and supple without adenopathy or JVD. There are no carotid bruits. Back is nontender and there is no CVA tenderness. Lungs are clear without rales, wheezes, or rhonchi. Chest is nontender. Heart has regular rate and rhythm without murmur. Abdomen is soft, flat, nontender without masses or hepatosplenomegaly and peristalsis is normoactive. Extremities have 1+ edema, full range of motion is present. Skin is warm and dry  without rash. Neurologic: Mental status is normal, cranial nerves are intact, there are no motor or sensory deficits.  ED Course  Procedures (including critical care time) Labs Review Results for orders placed or performed during the hospital encounter of 01/20/15  Protime-INR  Result Value Ref Range   Prothrombin Time 14.3 11.6 - 15.2 seconds   INR 1.09 0.00 - 1.49  APTT  Result Value Ref Range   aPTT 28 24 - 37 seconds  CBC  Result Value Ref Range   WBC 8.9 4.0 - 10.5 K/uL   RBC 4.63 4.22 - 5.81 MIL/uL   Hemoglobin 14.7 13.0 - 17.0 g/dL   HCT 43.1 39.0 - 52.0 %   MCV 93.1 78.0 - 100.0 fL   MCH 31.7 26.0 - 34.0 pg   MCHC 34.1 30.0 - 36.0 g/dL   RDW 13.3 11.5 - 15.5 %   Platelets 172 150 - 400 K/uL  Differential  Result Value Ref Range   Neutrophils Relative % 72 %   Neutro Abs 6.4 1.7 - 7.7 K/uL   Lymphocytes Relative 16 %   Lymphs Abs 1.4 0.7 - 4.0 K/uL   Monocytes Relative 7 %   Monocytes Absolute 0.6 0.1 - 1.0 K/uL   Eosinophils Relative 5 %   Eosinophils Absolute 0.5 0.0 - 0.7 K/uL   Basophils Relative 0 %   Basophils Absolute 0.0 0.0 - 0.1 K/uL  I-Stat Chem 8, ED  (not at [facility], [facility])  Result Value Ref Range   Sodium 144 135 - 145 mmol/L   Potassium 3.6 3.5 - 5.1 mmol/L   Chloride 99 (L) 101 - 111 mmol/L   BUN 21 (H) 6 - 20 mg/dL   Creatinine, Ser 1.50 (H) 0.61 - 1.24 mg/dL   Glucose, Bld 140 (H) 65 - 99 mg/dL   Calcium, Ion 1.32 (H) 1.13 - 1.30 mmol/L   TCO2 30 0 - 100 mmol/L   Hemoglobin 15.3 13.0 - 17.0 g/dL   HCT 45.0 39.0 - 52.0 %  I-stat troponin, ED (not at [facility], [facility])  Result Value Ref Range   Troponin i, poc 0.01 0.00 - 0.08 ng/mL   Comment 3            Imaging Review Ct Head Wo Contrast  01/20/2015  CLINICAL DATA:  Aphasia.  Code stroke. EXAM: CT HEAD WITHOUT CONTRAST TECHNIQUE: Contiguous axial images were obtained from the base of the skull through the vertex without intravenous contrast. COMPARISON:  MRI brain 01/08/2013 FINDINGS: There is no  evidence acute intracranial hemorrhage, mass lesion, brain edema or extra-axial fluid collection. There is extensive low density throughout the periventricular and subcortical white matter bilaterally which appears similar to previous  MRI. There is involvement of the basal ganglia bilaterally. There is mild generalized atrophy. The visualized paranasal sinuses, mastoid air cells and middle ears are clear. The calvarium is intact. IMPRESSION: No evidence of acute intracranial hemorrhage or obvious change in extensive chronic small vessel ischemic changes. These results were called by telephone at the time of interpretation on 01/20/2015 at 8:30 pm to Dr. Nicole Kindred, who verbally acknowledged these results. Electronically Signed   By: Richardean Sale M.D.   On: 01/20/2015 20:31   I have personally reviewed and evaluated these images and lab results as part of my medical decision-making.   EKG Interpretation   Date/Time:  Wednesday January 20 2015 20:23:48 EST Ventricular Rate:  65 PR Interval:  60 QRS Duration: 110 QT Interval:  424 QTC Calculation: 441 R Axis:   23 Text Interpretation:  Sinus rhythm Short PR interval Incomplete left  bundle branch block Baseline wander in lead(s) V2 When compared with ECG  of 10/05/2011, Decreased T wave amplitude noted diffusely Confirmed by  Roxanne Mins  MD, Nolin Grell ([id number]) on 01/20/2015 8:34:11 PM      CRITICAL CARE Performed by: [id number] Total critical care time: 35 minutes Critical care time was exclusive of separately billable procedures and treating other patients. Critical care was necessary to treat or prevent imminent or life-threatening deterioration. Critical care was time spent personally by me on the following activities: development of treatment plan with patient and/or surrogate as well as nursing, discussions with consultants, evaluation of patient's response to treatment, examination of patient, obtaining history from patient or surrogate, ordering  and performing treatments and interventions, ordering and review of laboratory studies, ordering and review of radiographic studies, pulse oximetry and re-evaluation of patient's condition.  MDM   Final diagnoses:  Transient cerebral ischemia, unspecified transient cerebral ischemia type  Renal insufficiency    Transient speech disturbance which may represent TIA. Old records are reviewed and he has been evaluated and treated by neurology service for presumed multiple sclerosis. He has had MRI scans with lesions consistent with multiple sclerosis. I do not see any evidence of studies of the vasculature. Anticipate admission for TIA evaluation.  See CT of head is unremarkable and patient has returned to baseline neurologic status. Wife has arrived and asked she states that he was first noted to have speech problems at 7:15. Case was seen in conjunction with Dr. Nicole Kindred of neurology service. He appears to have had a transient ischemic attack and will be admitted for TIA workup. Case is discussed with Dr. Tamala Julian of [facility] who agrees to admit the patient.    Delora Fuel, MD [id number] [phone]

## 2015-01-21 ENCOUNTER — Observation Stay (HOSPITAL_COMMUNITY): Payer: Medicare Other

## 2015-01-21 ENCOUNTER — Ambulatory Visit (HOSPITAL_BASED_OUTPATIENT_CLINIC_OR_DEPARTMENT_OTHER): Payer: Medicare Other

## 2015-01-21 DIAGNOSIS — R413 Other amnesia: Secondary | ICD-10-CM | POA: Diagnosis present

## 2015-01-21 DIAGNOSIS — N289 Disorder of kidney and ureter, unspecified: Secondary | ICD-10-CM

## 2015-01-21 DIAGNOSIS — R0602 Shortness of breath: Secondary | ICD-10-CM | POA: Diagnosis not present

## 2015-01-21 DIAGNOSIS — G451 Carotid artery syndrome (hemispheric): Secondary | ICD-10-CM | POA: Diagnosis not present

## 2015-01-21 DIAGNOSIS — I639 Cerebral infarction, unspecified: Secondary | ICD-10-CM

## 2015-01-21 DIAGNOSIS — I10 Essential (primary) hypertension: Secondary | ICD-10-CM | POA: Diagnosis not present

## 2015-01-21 DIAGNOSIS — G459 Transient cerebral ischemic attack, unspecified: Secondary | ICD-10-CM

## 2015-01-21 HISTORY — DX: Cerebral infarction, unspecified: I63.9

## 2015-01-21 LAB — BASIC METABOLIC PANEL
Anion gap: 6 (ref 5–15)
BUN: 19 mg/dL (ref 6–20)
CHLORIDE: 105 mmol/L (ref 101–111)
CO2: 32 mmol/L (ref 22–32)
CREATININE: 1.43 mg/dL — AB (ref 0.61–1.24)
Calcium: 9.5 mg/dL (ref 8.9–10.3)
GFR calc Af Amer: 57 mL/min — ABNORMAL LOW (ref >60)
GFR, EST NON AFRICAN AMERICAN: 49 mL/min — AB (ref >60)
Glucose, Bld: 85 mg/dL (ref 65–99)
Potassium: 3.7 mmol/L (ref 3.5–5.1)
SODIUM: 143 mmol/L (ref 135–145)

## 2015-01-21 LAB — LIPID PANEL
CHOL/HDL RATIO: 3.3 ratio
Cholesterol: 112 mg/dL (ref 0–200)
HDL: 34 mg/dL — AB (ref >40)
LDL CALC: 33 mg/dL (ref 0–99)
TRIGLYCERIDES: 226 mg/dL — AB (ref <150)
VLDL: 45 mg/dL — ABNORMAL HIGH (ref 0–40)

## 2015-01-21 MED ORDER — ATORVASTATIN CALCIUM 20 MG PO TABS
20.0000 mg | ORAL_TABLET | Freq: Every day | ORAL | Status: DC
  Administered 2015-01-21: 20 mg via ORAL
  Filled 2015-01-21: qty 1

## 2015-01-21 MED ORDER — TRAZODONE HCL 50 MG PO TABS
50.0000 mg | ORAL_TABLET | Freq: Every day | ORAL | Status: DC
  Administered 2015-01-21: 50 mg via ORAL
  Filled 2015-01-21: qty 1

## 2015-01-21 MED ORDER — CALCIUM CARBONATE ANTACID 500 MG PO CHEW
1.0000 | CHEWABLE_TABLET | Freq: Three times a day (TID) | ORAL | Status: DC
  Administered 2015-01-21 – 2015-01-22 (×4): 200 mg via ORAL
  Filled 2015-01-21 (×4): qty 1

## 2015-01-21 MED ORDER — BUPROPION HCL ER (SR) 100 MG PO TB12
200.0000 mg | ORAL_TABLET | Freq: Three times a day (TID) | ORAL | Status: DC
Start: 2015-01-21 — End: 2015-01-22
  Administered 2015-01-21 – 2015-01-22 (×4): 200 mg via ORAL
  Filled 2015-01-21 (×6): qty 2

## 2015-01-21 MED ORDER — CLOPIDOGREL BISULFATE 75 MG PO TABS: 75.0000 mg | ORAL_TABLET | Freq: Every day | ORAL | Status: AC

## 2015-01-21 MED ORDER — DICLOFENAC SODIUM 1 % TD GEL: 2.0000 g | Freq: Every day | TRANSDERMAL | Status: DC | PRN

## 2015-01-21 MED ORDER — ADULT MULTIVITAMIN W/MINERALS CH
1.0000 | ORAL_TABLET | Freq: Every day | ORAL | Status: DC
  Administered 2015-01-21 – 2015-01-22 (×2): 1 via ORAL
  Filled 2015-01-21 (×2): qty 1

## 2015-01-21 MED ORDER — LOSARTAN POTASSIUM-HCTZ 50-12.5 MG PO TABS: 1.0000 | ORAL_TABLET | Freq: Every day | ORAL | Status: DC

## 2015-01-21 MED ORDER — LOSARTAN POTASSIUM 50 MG PO TABS
50.0000 mg | ORAL_TABLET | Freq: Every day | ORAL | Status: DC
  Administered 2015-01-21 – 2015-01-22 (×2): 50 mg via ORAL
  Filled 2015-01-21 (×2): qty 1

## 2015-01-21 MED ORDER — TEMAZEPAM 7.5 MG PO CAPS: 15.0000 mg | ORAL_CAPSULE | Freq: Every evening | ORAL | Status: DC | PRN

## 2015-01-21 MED ORDER — TAMSULOSIN HCL 0.4 MG PO CAPS
0.4000 mg | ORAL_CAPSULE | Freq: Every day | ORAL | Status: DC
Start: 2015-01-21 — End: 2015-01-22
  Administered 2015-01-21 – 2015-01-22 (×2): 0.4 mg via ORAL
  Filled 2015-01-21 (×2): qty 1

## 2015-01-21 MED ORDER — STROKE: EARLY STAGES OF RECOVERY BOOK
Freq: Once | Status: AC
  Administered 2015-01-21: 04:00:00
  Filled 2015-01-21: qty 1

## 2015-01-21 MED ORDER — ENOXAPARIN SODIUM 40 MG/0.4ML ~~LOC~~ SOLN
40.0000 mg | SUBCUTANEOUS | Status: DC
Start: 2015-01-21 — End: 2015-01-22
  Administered 2015-01-21: 40 mg via SUBCUTANEOUS
  Filled 2015-01-21 (×2): qty 0.4

## 2015-01-21 MED ORDER — ALPRAZOLAM 0.25 MG PO TABS
0.2500 mg | ORAL_TABLET | Freq: Three times a day (TID) | ORAL | Status: DC | PRN
  Filled 2015-01-21: qty 1

## 2015-01-21 MED ORDER — CLOPIDOGREL BISULFATE 75 MG PO TABS
75.0000 mg | ORAL_TABLET | Freq: Every day | ORAL | Status: DC
  Administered 2015-01-21 – 2015-01-22 (×2): 75 mg via ORAL
  Filled 2015-01-21 (×2): qty 1

## 2015-01-21 MED ORDER — AMLODIPINE BESYLATE 2.5 MG PO TABS: 2.5000 mg | ORAL_TABLET | Freq: Every day | ORAL | Status: DC

## 2015-01-21 MED ORDER — LOSARTAN POTASSIUM 50 MG PO TABS: 50.0000 mg | ORAL_TABLET | Freq: Every day | ORAL | Status: DC

## 2015-01-21 MED ORDER — SENNOSIDES-DOCUSATE SODIUM 8.6-50 MG PO TABS: 1.0000 | ORAL_TABLET | Freq: Every evening | ORAL | Status: DC | PRN

## 2015-01-21 MED ORDER — ASPIRIN 325 MG PO TABS
325.0000 mg | ORAL_TABLET | Freq: Every day | ORAL | Status: DC
  Administered 2015-01-21: 325 mg via ORAL
  Filled 2015-01-21: qty 1

## 2015-01-21 MED ORDER — HYDROCHLOROTHIAZIDE 12.5 MG PO CAPS
12.5000 mg | ORAL_CAPSULE | Freq: Every day | ORAL | Status: DC
  Administered 2015-01-21: 12.5 mg via ORAL
  Filled 2015-01-21: qty 1

## 2015-01-21 MED ORDER — AMPHETAMINE-DEXTROAMPHETAMINE 10 MG PO TABS
20.0000 mg | ORAL_TABLET | Freq: Three times a day (TID) | ORAL | Status: DC
  Administered 2015-01-21 – 2015-01-22 (×4): 20 mg via ORAL
  Filled 2015-01-21 (×4): qty 2

## 2015-01-21 NOTE — Discharge Summary (Signed)
PATIENT DETAILS Name: Reginald Edwards Age: 70 y.o. Sex: male Date of Birth: February 02, 1945 MRN: [id number]. Admitting Physician: Norval Morton, MD [id number], Linton Ham, MD  Admit Date: 01/20/2015 Discharge date: 01/22/2015  Recommendations for Outpatient Follow-up:  1. New Medications: Plavix, Amlodipine 2. Medication discontinued: HCTZ, ASA  3. Please repeat CBC/BMET at next visit 4. Ensure follow up with Neurology 5. Follow A1C-pending at time of discharge 6. Follow ceruloplasmin levels-pending at time of discharge  PRIMARY DISCHARGE DIAGNOSIS:  Principal Problem:   TIA (transient ischemic attack) Active Problems:   Hearing loss   Right foot drop   Right sided weakness   Dysarthria   Memory loss   Renal insufficiency      PAST MEDICAL HISTORY: Past Medical History  Diagnosis Date  . Hyperlipemia   . Essential hypertension   . Mood disorder ([place])   . Hearing loss   . Arthritis   . Neuromuscular disorder ([place])   . Depression   . Anxiety   . DJD (degenerative joint disease) of lumbar spine   . Cancer ([place])     skin   . TIA (transient ischemic attack)     DISCHARGE MEDICATIONS: Current Discharge Medication List    START taking these medications   Details  amLODipine (NORVASC) 2.5 MG tablet Take 1 tablet (2.5 mg total) by mouth daily. Qty: 30 tablet, Refills: 0    clopidogrel (PLAVIX) 75 MG tablet Take 1 tablet (75 mg total) by mouth daily. Qty: 30 tablet, Refills: 0    losartan (COZAAR) 50 MG tablet Take 1 tablet (50 mg total) by mouth daily. Qty: 30 tablet, Refills: 0      CONTINUE these medications which have NOT CHANGED   Details  ALPRAZolam (XANAX) 0.25 MG tablet Take 0.25 mg by mouth 3 (three) times daily.     amphetamine-dextroamphetamine (ADDERALL) 20 MG tablet Take 20 mg by mouth 3 (three) times daily.      atorvastatin (LIPITOR) 20 MG tablet TAKE 1 TABLET BY MOUTH EVERY DAY Qty: 90 tablet, Refills: 3    buPROPion (WELLBUTRIN SR) 200 MG  12 hr tablet Take 200 mg by mouth 3 (three) times daily.    calcium carbonate (TUMS - DOSED IN MG ELEMENTAL CALCIUM) 500 MG chewable tablet Chew 1 tablet by mouth 3 (three) times daily.    diclofenac sodium (VOLTAREN) 1 % GEL Apply 2 g topically daily as needed (pain).    Multiple Vitamin (MULTIVITAMIN) tablet Take 1 tablet by mouth daily.    tamsulosin (FLOMAX) 0.4 MG CAPS capsule Take 0.4 mg by mouth daily.     temazepam (RESTORIL) 15 MG capsule Take 15 mg by mouth at bedtime as needed for sleep. X 2    traZODone (DESYREL) 50 MG tablet Take 50 mg by mouth at bedtime.    propranolol ER (INDERAL LA) 80 MG 24 hr capsule Take 1 capsule (80 mg total) by mouth daily. Qty: 90 capsule, Refills: 3      STOP taking these medications     aspirin 325 MG tablet      losartan-hydrochlorothiazide (HYZAAR) 50-12.5 MG tablet         ALLERGIES:   Allergies  Allergen Reactions  . Clorazepate     Makes him extremely groggy  . Lisinopril     REACTION: constant cough,irritation  . Vicodin [Hydrocodone-Acetaminophen]     Vomiting, drowsy, hallucinations    BRIEF HPI:  See H&P, Labs, Consult and Test reports for all details in brief, patient was admitted  for evaluation of transient right-sided weakness.  CONSULTATIONS:   neurology  PERTINENT RADIOLOGIC STUDIES: Dg Chest 2 View  01/21/2015  CLINICAL DATA:  Shortness of breath.  TIA.  Slurred speech. EXAM: CHEST  2 VIEW COMPARISON:  09/15/2008 rib radiographs. 01/24/2008 chest radiographs. FINDINGS: The cardiomediastinal silhouette is within normal limits. The patient has taken a shallower inspiration than on the prior studies and there is mild opacity in the lung bases likely reflecting atelectasis. No pleural effusion or pneumothorax is identified. No acute osseous abnormality is identified. IMPRESSION: Mild bibasilar atelectasis. Electronically Signed   By: Logan Bores M.D.   On: 01/21/2015 08:05   Ct Head Wo Contrast  01/20/2015   CLINICAL DATA:  Aphasia.  Code stroke. EXAM: CT HEAD WITHOUT CONTRAST TECHNIQUE: Contiguous axial images were obtained from the base of the skull through the vertex without intravenous contrast. COMPARISON:  MRI brain 01/08/2013 FINDINGS: There is no evidence acute intracranial hemorrhage, mass lesion, brain edema or extra-axial fluid collection. There is extensive low density throughout the periventricular and subcortical white matter bilaterally which appears similar to previous MRI. There is involvement of the basal ganglia bilaterally. There is mild generalized atrophy. The visualized paranasal sinuses, mastoid air cells and middle ears are clear. The calvarium is intact. IMPRESSION: No evidence of acute intracranial hemorrhage or obvious change in extensive chronic small vessel ischemic changes. These results were called by telephone at the time of interpretation on 01/20/2015 at 8:30 pm to Dr. Nicole Kindred, who verbally acknowledged these results. Electronically Signed   By: Richardean Sale M.D.   On: 01/20/2015 20:31   Mr Angiogram Head Wo Contrast  01/20/2015  CLINICAL DATA:  Acute onset speech difficulty, weakness. History of hypertension, hyperlipidemia, cancer. On daily aspirin. EXAM: MRI HEAD WITHOUT CONTRAST MRA HEAD WITHOUT CONTRAST TECHNIQUE: Multiplanar, multiecho pulse sequences of the brain and surrounding structures were obtained without intravenous contrast. Angiographic images of the head were obtained using MRA technique without contrast. COMPARISON:  CT head January 19, 2014 at 2013 hours and MRI of the brain January 08, 2013 FINDINGS: MRI HEAD FINDINGS No reduced diffusion to suggest acute ischemia. Punctate focus of sub susceptibility artifact LEFT temporal lobe, nonspecific. Ventricles and sulci are normal for patient's age. Confluent supratentorial white matter FLAIR T2 hyperintensities, with patchy T2 hyperintense signal within the basal ganglia. No midline shift, mass effect or mass lesions.  No abnormal extra-axial fluid collections. Ocular globes and orbital contents are normal. Imaged paranasal sinuses are well-aerated. Mastoid air cells are well aerated. MRA HEAD FINDINGS Anterior circulation: Normal flow related enhancement of the included cervical, petrous, cavernous and supraclinoid internal carotid arteries. LEFT ophthalmic artery infundibulum. Patent anterior communicating artery. Normal flow related enhancement of the anterior and middle cerebral arteries, including distal segments. Supernumerary anterior cerebral artery arising from RIGHT A1-2 junction. No large vessel occlusion, high-grade stenosis, abnormal luminal irregularity, aneurysm. Posterior circulation: Codominant vertebral arteries. Basilar artery is patent, with normal flow related enhancement of the main branch vessels. Tiny bilateral posterior communicating arteries present. Normal flow related enhancement of the posterior cerebral arteries. No large vessel occlusion, high-grade stenosis, abnormal luminal irregularity, aneurysm. IMPRESSION: MRI HEAD: No acute intracranial process, specifically no acute ischemia. Moderate to severe white matter changes in a pattern most compatible with chronic small vessel ischemic disease. Bilateral basal ganglia edema suggesting sequelae of hypertension/PRES, metabolic disorder. MRA HEAD: Negative. Electronically Signed   By: Elon Alas M.D.   On: 01/20/2015 21:50   Mr Brain Wo Contrast  01/20/2015  CLINICAL DATA:  Acute onset speech difficulty, weakness. History of hypertension, hyperlipidemia, cancer. On daily aspirin. EXAM: MRI HEAD WITHOUT CONTRAST MRA HEAD WITHOUT CONTRAST TECHNIQUE: Multiplanar, multiecho pulse sequences of the brain and surrounding structures were obtained without intravenous contrast. Angiographic images of the head were obtained using MRA technique without contrast. COMPARISON:  CT head January 19, 2014 at 2013 hours and MRI of the brain January 08, 2013  FINDINGS: MRI HEAD FINDINGS No reduced diffusion to suggest acute ischemia. Punctate focus of sub susceptibility artifact LEFT temporal lobe, nonspecific. Ventricles and sulci are normal for patient's age. Confluent supratentorial white matter FLAIR T2 hyperintensities, with patchy T2 hyperintense signal within the basal ganglia. No midline shift, mass effect or mass lesions. No abnormal extra-axial fluid collections. Ocular globes and orbital contents are normal. Imaged paranasal sinuses are well-aerated. Mastoid air cells are well aerated. MRA HEAD FINDINGS Anterior circulation: Normal flow related enhancement of the included cervical, petrous, cavernous and supraclinoid internal carotid arteries. LEFT ophthalmic artery infundibulum. Patent anterior communicating artery. Normal flow related enhancement of the anterior and middle cerebral arteries, including distal segments. Supernumerary anterior cerebral artery arising from RIGHT A1-2 junction. No large vessel occlusion, high-grade stenosis, abnormal luminal irregularity, aneurysm. Posterior circulation: Codominant vertebral arteries. Basilar artery is patent, with normal flow related enhancement of the main branch vessels. Tiny bilateral posterior communicating arteries present. Normal flow related enhancement of the posterior cerebral arteries. No large vessel occlusion, high-grade stenosis, abnormal luminal irregularity, aneurysm. IMPRESSION: MRI HEAD: No acute intracranial process, specifically no acute ischemia. Moderate to severe white matter changes in a pattern most compatible with chronic small vessel ischemic disease. Bilateral basal ganglia edema suggesting sequelae of hypertension/PRES, metabolic disorder. MRA HEAD: Negative. Electronically Signed   By: Elon Alas M.D.   On: 01/20/2015 21:50     PERTINENT LAB RESULTS: CBC:  Recent Labs  01/20/15 2011 01/20/15 2017  WBC  --  8.9  HGB 15.3 14.7  HCT 45.0 43.1  PLT  --  172    CMET CMP     Component Value Date/Time   NA 141 01/22/2015 0523   K 3.8 01/22/2015 0523   CL 104 01/22/2015 0523   CO2 32 01/22/2015 0523   GLUCOSE 88 01/22/2015 0523   BUN 16 01/22/2015 0523   CREATININE 1.40* 01/22/2015 0523   CREATININE 1.01 11/30/2014 0958   CALCIUM 9.4 01/22/2015 0523   PROT 6.1* 01/20/2015 2017   ALBUMIN 3.8 01/20/2015 2017   AST 24 01/20/2015 2017   ALT 21 01/20/2015 2017   ALKPHOS 70 01/20/2015 2017   BILITOT 0.6 01/20/2015 2017   GFRNONAA 50* 01/22/2015 0523   GFRNONAA 55* 12/24/2013 1229   GFRAA 59* 01/22/2015 0523   GFRAA 63 12/24/2013 1229    GFR Estimated Creatinine Clearance: 57.2 mL/min (by C-G formula based on Cr of 1.4). No results for input(s): LIPASE, AMYLASE in the last 72 hours. No results for input(s): CKTOTAL, CKMB, CKMBINDEX, TROPONINI in the last 72 hours. Invalid input(s): POCBNP No results for input(s): DDIMER in the last 72 hours.  Recent Labs  01/20/15 0852  HGBA1C 6.0*    Recent Labs  01/21/15 0550  CHOL 112  HDL 34*  LDLCALC 33  TRIG 226*  CHOLHDL 3.3   No results for input(s): TSH, T4TOTAL, T3FREE, THYROIDAB in the last 72 hours.  Invalid input(s): FREET3 No results for input(s): VITAMINB12, FOLATE, FERRITIN, TIBC, IRON, RETICCTPCT in the last 72 hours. Coags:  Recent Labs  01/20/15 2017  INR 1.09  Microbiology: No results found for this or any previous visit (from the past 240 hour(s)).   BRIEF HOSPITAL COURSE:  TIA (transient ischemic attack): Presented with transient right-sided weakness and dysarthria. Nonfocal exam at the time of discharge. Seen by neurology underwent further workup. MRI brain and did not show any acute CVA, MRA brain was unremarkable. Carotid Doppler did not show any significant stenosis. 2-D echocardiogram without any embolic source. LDL 33, A1c pending. Neurology recommends switching from aspirin to Plavix on discharge. Please ensure neurology follow-up in the next few  weeks.  AKI: Likely prerenal azotemia-. HCTZ-continue losartan cautiously for now. 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   Objective:   Blood pressure 146/77, pulse 66, temperature 97.8 F (36.6 C), temperature source Oral, resp. rate 20, height 5\' 8"  (1.727 m), weight 94.8 kg (208 lb 15.9 oz), SpO2 93 %.  Intake/Output Summary (Last 24 hours) at 01/22/15 1106 Last data filed at 01/22/15 1006  Gross per 24 hour  Intake    240 ml  Output   1215 ml  Net   -975 ml   Filed Weights   01/20/15 2301  Weight: 94.8 kg (208 lb 15.9 oz)    Exam Awake Alert, Oriented *3, No new F.N deficits, Normal affect [place].AT,PERRAL Supple Neck,No JVD, No cervical lymphadenopathy appriciated.  Symmetrical Chest wall movement, Good air movement bilaterally, CTAB RRR,No Gallops,Rubs or new Murmurs, No Parasternal Heave +ve B.Sounds, Abd Soft, Non tender, No organomegaly appriciated, No rebound -guarding or rigidity. No Cyanosis, Clubbing or edema, No new Rash or bruise  DISCHARGE CONDITION: Stable  DISPOSITION: Home with home health services  DISCHARGE INSTRUCTIONS:    Activity:  As tolerated with Full fall precautions use walker/cane & assistance as needed  Get Medicines reviewed and adjusted: Please take all your medications with you for your next visit with your Primary MD  Please request your Primary MD to go over all hospital tests and procedure/radiological results at the follow up, please ask your Primary MD to get all Hospital  records sent to his/her office.  If you experience worsening of your admission symptoms, develop shortness of breath, life threatening emergency, suicidal or homicidal thoughts you must seek medical attention immediately by calling 911 or calling your MD immediately  if symptoms less severe.  You must read complete instructions/literature along with all the possible adverse reactions/side effects for all the Medicines you take and that have been prescribed to you. Take any new Medicines after you have completely understood and accpet all the possible adverse reactions/side effects.   Do not drive when taking Pain medications.   Do not take more than prescribed Pain, Sleep and Anxiety Medications  Special Instructions: If you have smoked or chewed Tobacco  in the last 2 yrs please stop smoking, stop any regular Alcohol  and or any Recreational drug use.  Wear Seat belts while driving.  Please note  You were cared for by a hospitalist during your hospital stay. Once you are discharged, your primary care physician will handle any further medical issues. Please note that NO REFILLS for any discharge medications will be authorized  once you are discharged, as it is imperative that you return to your primary care physician (or establish a relationship with a primary care physician if you do not have one) for your aftercare needs so that they can reassess your need for medications and monitor your lab values.   Diet recommendation: Heart Healthy diet  Discharge Instructions    Ambulatory referral to Neurology    Complete by:  As directed   An appointment is requested in approximately: 4 weeks. Seen by Dr. Leonie Man in the hospital for TIA. Needs followup with [facility]     Ambulatory referral to Neurology    Complete by:  As directed   An appointment is requested in approximately: 2 weeks     Diet - low sodium heart healthy    Complete by:  As directed      Increase activity slowly    Complete by:   As directed            Follow-up Information    Follow up with PENUMALLI,VIKRAM, MD In 4 weeks.   Specialties:  Neurology, Radiology   Why:  Office will call you with appointment date & time   Contact information:   [place] [id number] [phone]       Follow up with DOOLITTLE, Linton Ham, MD. Schedule an appointment as soon as possible for a visit in 1 week.   Specialties:  Internal Medicine, Adolescent Medicine   Why:  Repeat electrolytes, Hospital follow up   Contact information:   [place] [id number]      Total Time spent on discharge equals  45 minutes.  SignedOren Binet 01/22/2015 11:06 AM

## 2015-01-21 NOTE — Progress Notes (Signed)
PATIENT DETAILS Name: Reginald Edwards Age: 70 y.o. Sex: male Date of Birth: 03-06-1945 Admit Date: 01/20/2015 Admitting Physician Norval Morton, MD [id number], Linton Ham, MD  Subjective: Dysarthria and right sided weakness has resolved.  Assessment/Plan: BRIEF HOSPITAL COURSE:  TIA (transient ischemic attack): Presented with transient right-sided weakness and dysarthria. Nonfocal exam at the time of discharge. Seen by neurology underwent further workup. MRI brain and did not show any acute CVA, MRA brain was unremarkable. Carotid Doppler did not show any significant stenosis. 2-D echocardiogram pending. LDL 33, A1c pending. Neurology recommends switching from aspirin to Plavix on discharge.   AKI: Likely prerenal azotemia-will d/c HCTZ-continue losartan cautiously for now.  Hypertension: HCTZ discontinued due to ARF-continue losartan-follow  Dyslipidemia: Continue statin  BPH: Continue Flomax  Severe bilateral right > than left L5 radiculopathies (due to lumbar spinal stenosis)-resume outpatient neurology follow-up  Disposition: Remain inpatient-home once work up complete  Antimicrobial agents  See below  Anti-infectives    None      DVT Prophylaxis: Prophylactic Lovenox   Code Status: Full code   Family Communication Spouse at bedside  Procedures: None  CONSULTS:  Neuro  Time spent 25 minutes-Greater than 50% of this time was spent in counseling, explanation of diagnosis, planning of further management, and coordination of care.  MEDICATIONS: Scheduled Meds: . amphetamine-dextroamphetamine  20 mg Oral TID  . atorvastatin  20 mg Oral q1800  . buPROPion  200 mg Oral TID  . calcium carbonate  1 tablet Oral TID  . clopidogrel  75 mg Oral Daily  . enoxaparin (LOVENOX) injection  40 mg Subcutaneous Q24H  . losartan  50 mg Oral Daily   And  . hydrochlorothiazide  12.5 mg Oral Daily  . multivitamin with minerals  1 tablet Oral Daily    . tamsulosin  0.4 mg Oral Daily  . traZODone  50 mg Oral QHS   Continuous Infusions:  PRN Meds:.ALPRAZolam, diclofenac sodium, senna-docusate, temazepam    PHYSICAL EXAM: Vital signs in last 24 hours: Filed Vitals:   01/21/15 0106 01/21/15 0302 01/21/15 0509 01/21/15 1606  BP: 135/82 128/77 137/74 129/76  Pulse: 60 60 53 62  Temp: 97.6 F (36.4 C) 97.5 F (36.4 C) 98.2 F (36.8 C) 98.3 F (36.8 C)  TempSrc: Oral Oral Oral Oral  Resp: 18 20 18 18   Height:      Weight:      SpO2: 94% 95% 95% 96%    Weight change:  Filed Weights   01/20/15 2301  Weight: 94.8 kg (208 lb 15.9 oz)   Body mass index is 31.79 kg/(m^2).   Gen Exam: Awake and alert with clear speech.  Neck: Supple, No JVD.   Chest: B/L Clear.   CVS: S1 S2 Regular, no murmurs.  Abdomen: soft, BS +, non tender, non distended. Extremities: no edema, lower extremities warm to touch. Neurologic: Non Focal.   Skin: No Rash.   Wounds: N/A.    Intake/Output from previous day:  Intake/Output Summary (Last 24 hours) at 01/21/15 1923 Last data filed at 01/21/15 1745  Gross per 24 hour  Intake      0 ml  Output   1225 ml  Net  -1225 ml     LAB RESULTS: CBC  Recent Labs Lab 01/20/15 2011 01/20/15 2017  WBC  --  8.9  HGB 15.3 14.7  HCT 45.0 43.1  PLT  --  172  MCV  --  93.1  MCH  --  31.7  MCHC  --  34.1  RDW  --  13.3  LYMPHSABS  --  1.4  MONOABS  --  0.6  EOSABS  --  0.5  BASOSABS  --  0.0    Chemistries   Recent Labs Lab 01/20/15 2011 01/20/15 2017 01/21/15 0550  NA 144 140 143  K 3.6 3.9 3.7  CL 99* 101 105  CO2  --  28 32  GLUCOSE 140* 142* 85  BUN 21* 18 19  CREATININE 1.50* 1.53* 1.43*  CALCIUM  --  10.5* 9.5    CBG: No results for input(s): GLUCAP in the last 168 hours.  GFR Estimated Creatinine Clearance: 56 mL/min (by C-G formula based on Cr of 1.43).  Coagulation profile  Recent Labs Lab 01/20/15 2017  INR 1.09    Cardiac Enzymes No results for input(s):  CKMB, TROPONINI, MYOGLOBIN in the last 168 hours.  Invalid input(s): CK  Invalid input(s): POCBNP No results for input(s): DDIMER in the last 72 hours. No results for input(s): HGBA1C in the last 72 hours.  Recent Labs  01/21/15 0550  CHOL 112  HDL 34*  LDLCALC 33  TRIG 226*  CHOLHDL 3.3   No results for input(s): TSH, T4TOTAL, T3FREE, THYROIDAB in the last 72 hours.  Invalid input(s): FREET3 No results for input(s): VITAMINB12, FOLATE, FERRITIN, TIBC, IRON, RETICCTPCT in the last 72 hours. No results for input(s): LIPASE, AMYLASE in the last 72 hours.  Urine Studies No results for input(s): UHGB, CRYS in the last 72 hours.  Invalid input(s): UACOL, UAPR, USPG, UPH, UTP, UGL, UKET, UBIL, UNIT, UROB, ULEU, UEPI, UWBC, URBC, UBAC, CAST, UCOM, BILUA  MICROBIOLOGY: No results found for this or any previous visit (from the past 240 hour(s)).  RADIOLOGY STUDIES/RESULTS: Dg Chest 2 View  01/21/2015  CLINICAL DATA:  Shortness of breath.  TIA.  Slurred speech. EXAM: CHEST  2 VIEW COMPARISON:  09/15/2008 rib radiographs. 01/24/2008 chest radiographs. FINDINGS: The cardiomediastinal silhouette is within normal limits. The patient has taken a shallower inspiration than on the prior studies and there is mild opacity in the lung bases likely reflecting atelectasis. No pleural effusion or pneumothorax is identified. No acute osseous abnormality is identified. IMPRESSION: Mild bibasilar atelectasis. Electronically Signed   By: Logan Bores M.D.   On: 01/21/2015 08:05   Ct Head Wo Contrast  01/20/2015  CLINICAL DATA:  Aphasia.  Code stroke. EXAM: CT HEAD WITHOUT CONTRAST TECHNIQUE: Contiguous axial images were obtained from the base of the skull through the vertex without intravenous contrast. COMPARISON:  MRI brain 01/08/2013 FINDINGS: There is no evidence acute intracranial hemorrhage, mass lesion, brain edema or extra-axial fluid collection. There is extensive low density throughout the  periventricular and subcortical white matter bilaterally which appears similar to previous MRI. There is involvement of the basal ganglia bilaterally. There is mild generalized atrophy. The visualized paranasal sinuses, mastoid air cells and middle ears are clear. The calvarium is intact. IMPRESSION: No evidence of acute intracranial hemorrhage or obvious change in extensive chronic small vessel ischemic changes. These results were called by telephone at the time of interpretation on 01/20/2015 at 8:30 pm to Dr. Nicole Kindred, who verbally acknowledged these results. Electronically Signed   By: Richardean Sale M.D.   On: 01/20/2015 20:31   Mr Angiogram Head Wo Contrast  01/20/2015  CLINICAL DATA:  Acute onset speech difficulty, weakness. History of hypertension, hyperlipidemia, cancer. On daily aspirin. EXAM: MRI HEAD WITHOUT CONTRAST MRA HEAD  WITHOUT CONTRAST TECHNIQUE: Multiplanar, multiecho pulse sequences of the brain and surrounding structures were obtained without intravenous contrast. Angiographic images of the head were obtained using MRA technique without contrast. COMPARISON:  CT head January 19, 2014 at 2013 hours and MRI of the brain January 08, 2013 FINDINGS: MRI HEAD FINDINGS No reduced diffusion to suggest acute ischemia. Punctate focus of sub susceptibility artifact LEFT temporal lobe, nonspecific. Ventricles and sulci are normal for patient's age. Confluent supratentorial white matter FLAIR T2 hyperintensities, with patchy T2 hyperintense signal within the basal ganglia. No midline shift, mass effect or mass lesions. No abnormal extra-axial fluid collections. Ocular globes and orbital contents are normal. Imaged paranasal sinuses are well-aerated. Mastoid air cells are well aerated. MRA HEAD FINDINGS Anterior circulation: Normal flow related enhancement of the included cervical, petrous, cavernous and supraclinoid internal carotid arteries. LEFT ophthalmic artery infundibulum. Patent anterior  communicating artery. Normal flow related enhancement of the anterior and middle cerebral arteries, including distal segments. Supernumerary anterior cerebral artery arising from RIGHT A1-2 junction. No large vessel occlusion, high-grade stenosis, abnormal luminal irregularity, aneurysm. Posterior circulation: Codominant vertebral arteries. Basilar artery is patent, with normal flow related enhancement of the main branch vessels. Tiny bilateral posterior communicating arteries present. Normal flow related enhancement of the posterior cerebral arteries. No large vessel occlusion, high-grade stenosis, abnormal luminal irregularity, aneurysm. IMPRESSION: MRI HEAD: No acute intracranial process, specifically no acute ischemia. Moderate to severe white matter changes in a pattern most compatible with chronic small vessel ischemic disease. Bilateral basal ganglia edema suggesting sequelae of hypertension/PRES, metabolic disorder. MRA HEAD: Negative. Electronically Signed   By: Elon Alas M.D.   On: 01/20/2015 21:50   Mr Brain Wo Contrast  01/20/2015  CLINICAL DATA:  Acute onset speech difficulty, weakness. History of hypertension, hyperlipidemia, cancer. On daily aspirin. EXAM: MRI HEAD WITHOUT CONTRAST MRA HEAD WITHOUT CONTRAST TECHNIQUE: Multiplanar, multiecho pulse sequences of the brain and surrounding structures were obtained without intravenous contrast. Angiographic images of the head were obtained using MRA technique without contrast. COMPARISON:  CT head January 19, 2014 at 2013 hours and MRI of the brain January 08, 2013 FINDINGS: MRI HEAD FINDINGS No reduced diffusion to suggest acute ischemia. Punctate focus of sub susceptibility artifact LEFT temporal lobe, nonspecific. Ventricles and sulci are normal for patient's age. Confluent supratentorial white matter FLAIR T2 hyperintensities, with patchy T2 hyperintense signal within the basal ganglia. No midline shift, mass effect or mass lesions. No  abnormal extra-axial fluid collections. Ocular globes and orbital contents are normal. Imaged paranasal sinuses are well-aerated. Mastoid air cells are well aerated. MRA HEAD FINDINGS Anterior circulation: Normal flow related enhancement of the included cervical, petrous, cavernous and supraclinoid internal carotid arteries. LEFT ophthalmic artery infundibulum. Patent anterior communicating artery. Normal flow related enhancement of the anterior and middle cerebral arteries, including distal segments. Supernumerary anterior cerebral artery arising from RIGHT A1-2 junction. No large vessel occlusion, high-grade stenosis, abnormal luminal irregularity, aneurysm. Posterior circulation: Codominant vertebral arteries. Basilar artery is patent, with normal flow related enhancement of the main branch vessels. Tiny bilateral posterior communicating arteries present. Normal flow related enhancement of the posterior cerebral arteries. No large vessel occlusion, high-grade stenosis, abnormal luminal irregularity, aneurysm. IMPRESSION: MRI HEAD: No acute intracranial process, specifically no acute ischemia. Moderate to severe white matter changes in a pattern most compatible with chronic small vessel ischemic disease. Bilateral basal ganglia edema suggesting sequelae of hypertension/PRES, metabolic disorder. MRA HEAD: Negative. Electronically Signed   By: Elon Alas M.D.   On:  01/20/2015 21:50    Oren Binet, MD  [facility] Pager:[phone]  If 7PM-7AM, please contact night-coverage [email or web address] Password [facility] 01/21/2015, 7:23 PM

## 2015-01-21 NOTE — Progress Notes (Signed)
VASCULAR LAB PRELIMINARY  PRELIMINARY  PRELIMINARY  PRELIMINARY    Carotid duplex has been completed.  Bilateral:  1-39% ICA stenosis.  Vertebral artery flow is antegrade.    Janifer Adie, RVT, RDMS 01/21/2015, 12:52 PM

## 2015-01-21 NOTE — Progress Notes (Signed)
STROKE TEAM PROGRESS NOTE   HISTORY OF PRESENT ILLNESS Reginald Edwards is an 70 y.o. male with a history of hypertension, hyperlipidemia, depression and anxiety as well as an equivocal history of demyelinating disorder, brought to the emergency room and code stroke status following acute onset of difficulty and weakness 01/20/2015 at 715p (LKW). He had difficulty formulating words and his speech was slurred. He had diffuse weakness, but proportionate weakness involving his right leg. No facial droop was described. There is an equivocal history of TIAs as well. CT scan of his head showed no acute intracranial abnormality. Chronic small vessel ischemic changes were noted. Patient has been taking aspirin daily. Deficits resolved essentially in route to the ED. NIH stroke score was 0 at the time of this evaluation. Patient was not administered TPA secondary to episode resolved. He was admitted for further evaluation and treatment.   SUBJECTIVE (INTERVAL HISTORY) His wife is at the bedside, he is in the vascular lab. His wife is a Therapist, sports. She noted abnormalities last night during dinner. Pt reports a history of neurologic disease, MS vs Parkinson's - in the hospital in [facility] where he reported the group voted on his dx.  Overall he feels his condition is stable.    OBJECTIVE Temp:  [97.5 F (36.4 C)-98.2 F (36.8 C)] 98.2 F (36.8 C) (01/19 0509) Pulse Rate:  [53-63] 53 (01/19 0509) Cardiac Rhythm:  [-] Sinus bradycardia (01/19 0702) Resp:  [17-25] 18 (01/19 0509) BP: (126-160)/(74-88) 137/74 mmHg (01/19 0509) SpO2:  [94 %-97 %] 95 % (01/19 0509) Weight:  [94.8 kg (208 lb 15.9 oz)] 94.8 kg (208 lb 15.9 oz) (01/18 2301)  CBC:  Recent Labs Lab 01/20/15 2011 01/20/15 2017  WBC  --  8.9  NEUTROABS  --  6.4  HGB 15.3 14.7  HCT 45.0 43.1  MCV  --  93.1  PLT  --  [id number]    Basic Metabolic Panel:  Recent Labs Lab 01/20/15 2017 01/21/15 0550  NA 140 143  K 3.9 3.7  CL 101 105  CO2 28 32   GLUCOSE 142* 85  BUN 18 19  CREATININE 1.53* 1.43*  CALCIUM 10.5* 9.5    Lipid Panel:    Component Value Date/Time   CHOL 112 01/21/2015 0550   TRIG 226* 01/21/2015 0550   HDL 34* 01/21/2015 0550   CHOLHDL 3.3 01/21/2015 0550   VLDL 45* 01/21/2015 0550   LDLCALC 33 01/21/2015 0550   HgbA1c:  Lab Results  Component Value Date   HGBA1C 5.8* 12/24/2013   Urine Drug Screen: No results found for: LABOPIA, COCAINSCRNUR, LABBENZ, AMPHETMU, THCU, LABBARB    IMAGING  Dg Chest 2 View 01/21/2015   Mild bibasilar atelectasis.   Ct Head Wo Contrast 01/20/2015  No evidence of acute intracranial hemorrhage or obvious change in extensive chronic small vessel ischemic changes.   MRI HEAD 01/20/2015   No acute intracranial process, specifically no acute ischemia. Moderate to severe white matter changes in a pattern most compatible with chronic small vessel ischemic disease. Bilateral basal ganglia edema suggesting sequelae of hypertension/PRES, metabolic disorder.   MRA HEAD 01/20/2015   Negative.    PHYSICAL EXAM Pleasant middle-aged Caucasian male currently not in distress. . Afebrile. Head is nontraumatic. Neck is supple without bruit.    Cardiac exam no murmur or gallop. Lungs are clear to auscultation. Distal pulses are well felt. Neurological Exam :  Awake alert oriented 3. Diminished attention, registration and recall. No aphasia or apraxia dysarthria. Extraocular movements  show slight restriction of left lateral gaze and slowness saccades to the left. No nystagmus. No afferent pupillary defect. Vision acuity seems adequate. Fundi were not visualized. Face is symmetric without weakness. Tongue is midline. Motor system exam revealed no upper or lower extremity drift. Mild weakness of ankle dorsiflexors right more than left. Slight diminished sensation in the right foot from ankle down. Coordination is slow but accurate. Gait was not tested. ASSESSMENT/PLAN Mr. Reginald Edwards is a 71  y.o. male with history of hypertension, hyperlipidemia, depression, anxiety and an equivocal history of demyelinating disorder presenting with transient right sided weakness and speech difficulty. He did not receive IV t-PA due to episodes resolved.   Left brain TIA likely small vessel disaese  Resultant  Dysarthria and weakness resolved  MRI  No acute stroke  MRA  Unremarkable   Carotid Doppler  Done, results pending  2D Echo  Done, results pending  LDL 33  HgbA1c pending  Lovenox 40 mg sq daily for VTE prophylaxis  Diet Heart Room service appropriate?: Yes; Fluid consistency:: Thin  aspirin 325 mg daily prior to admission, now on aspirin 325 mg daily. Recommend change to Plavix. Order written.   Patient counseled to be compliant with his antithrombotic medications  Ongoing aggressive stroke risk factor management  Therapy recommendations:  HH PT  Disposition:  Return home  Follow up with Dr. Leta Baptist in 4 weeks. Requested.  Hypertension  Stable  Hyperlipidemia  Home meds:  lipitor 20, resumed in hospital  LDL 33, goal < 70  Continue statin at discharge  Other Stroke Risk Factors  Advanced age  Obesity, Body mass index is 31.79 kg/(m^2).   Hx TIA  Mild obstructive sleep apnea, declined CPAP  Other Active Problems  Chronic hearing loss  Cr 1.43  Neuro work up at [facility] ([place]) feel pt does not have MS. 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 I have made any additions or clarifications directly to the above note. Agree with note above. He presented with transient speech difficulty and right-sided weakness likely from a left hemispheric TIA etiology likely small vessel disease. He remains at risk for neurological worsening, recurrent stroke, TIA needs ongoing stroke evaluation and aggressive risk factor modification. I had a long discussion with the patient and his wife over the telephone and discussed plan of care and answered questions. Recommend discontinue aspirin and change to Plavix for secondary stroke prevention.  Antony Contras, MD Medical Director [facility] Pager: [phone] 01/21/2015 2:57 PM    To contact Stroke Continuity provider, please refer to [email or web address] After hours, contact General Neurology

## 2015-01-21 NOTE — Evaluation (Signed)
Occupational Therapy Evaluation Patient Details Name: Reginald Edwards MRN: [id number] DOB: July 22, 1945 Today's Date: 01/21/2015    History of Present Illness Reginald Edwards is a 70 year old right-hand dominant male with a past medical history for HTN, depression, spinal stenosis, gait abnormality, anxiety, HLD, and previous diagnosis refuted of Parkinson's and MS; who presents with complaints of difficulties speaking and weakness on his right hand   Clinical Impression   Pt was able to perform self care and mobility at a modified independent level prior to admission.  He has a hx of 2-3 falls per week despite consistent use of a rollator.  Pt presents with good UB strength, tremor which is variable and incoordination, impaired balance and decreased memory.  Recommend HHOT progressing quickly to OP neuro OT.  Follow Up Recommendations  Home health OT    Equipment Recommendations  None recommended by OT    Recommendations for Other Services       Precautions / Restrictions Precautions Precautions: Fall Precaution Comments: falls frequently at home, no severe injury able to get up unaided Restrictions Weight Bearing Restrictions: No      Mobility Bed Mobility Overal bed mobility: Modified Independent                Transfers Overall transfer level: Needs assistance   Transfers: Sit to/from Stand Sit to Stand: Supervision         General transfer comment: cues to avoid pulling up on walker    Balance     Sitting balance-Leahy Scale: Good       Standing balance-Leahy Scale: Fair Standing balance comment: stabilizes on external surfaces                            ADL Overall ADL's : Needs assistance/impaired Eating/Feeding: Independent;Sitting Eating/Feeding Details (indicate cue type and reason): sometimes has more spillage depending on tremor Grooming: Wash/dry hands;Supervision/safety;Standing Grooming Details (indicate cue type and reason):  leans on sink for stability Upper Body Bathing: Set up;Sitting   Lower Body Bathing: Supervison/ safety;Sit to/from stand   Upper Body Dressing : Set up;Sitting Upper Body Dressing Details (indicate cue type and reason): pt reports difficulty with buttoning Lower Body Dressing: Supervision/safety;Sit to/from stand Lower Body Dressing Details (indicate cue type and reason): braces LEs on bed when standing for stability Toilet Transfer: Min guard;Ambulation;RW Toilet Transfer Details (indicate cue type and reason): stood to urinate Toileting- Water quality scientist and Hygiene: Supervision/safety       Functional mobility during ADLs: Supervision/safety;Rolling walker       Vision Additional Comments: hx of esotropia in L eye   Perception     Praxis      Pertinent Vitals/Pain Pain Assessment: No/denies pain     Hand Dominance Right   Extremity/Trunk Assessment Upper Extremity Assessment Upper Extremity Assessment: RUE deficits/detail;LUE deficits/detail RUE Deficits / Details: strength WNL, mild tremor--varies RUE Coordination: decreased fine motor (micrographia, difficulty managing fasteners) LUE Deficits / Details: strength WNL, mild tremor--varies LUE Coordination: decreased fine motor   Lower Extremity Assessment Lower Extremity Assessment: Defer to PT evaluation   Cervical / Trunk Assessment Cervical / Trunk Assessment: Other exceptions (stenosis)   Communication Communication Communication: Expressive difficulties (loses "train of thought")   Cognition Arousal/Alertness: Awake/alert Behavior During Therapy: WFL for tasks assessed/performed Overall Cognitive Status: History of cognitive impairments - at baseline       Memory: Decreased short-term memory             General  Comments       Exercises       Shoulder Instructions      Home Living Family/patient expects to be discharged to:: Private residence Living Arrangements: Spouse/significant  other Available Help at Discharge: Family Type of Home: House Home Access: Stairs to enter Technical brewer of Steps: 4 Entrance Stairs-Rails: Can reach both [place]: One level     Bathroom Shower/Tub: Occupational psychologist: Handicapped height     [place]: Grab bars - tub/shower;Grab bars - toilet;Walker - 4 wheels;Shower seat (new motorized w/c)          Prior Functioning/Environment Level of Independence: Independent with assistive device(s)             OT Diagnosis: Cognitive deficits;Other (comment) (incoordination, impaired balance, tremor)   OT Problem List: Impaired balance (sitting and/or standing);Decreased coordination;Decreased safety awareness;Impaired UE functional use   OT Treatment/Interventions:      OT Goals(Current goals can be found in the care plan section) Acute Rehab OT Goals Patient Stated Goal: To return home  OT Frequency:     Barriers to D/C:            Co-evaluation              End of Session Equipment Utilized During Treatment: Rolling walker;Gait belt  Activity Tolerance: Patient tolerated treatment well Patient left: in bed;with call bell/phone within reach;with family/visitor present   Time: 1510-1544 OT Time Calculation (min): 34 min Charges:  OT General Charges $OT Visit: 1 Procedure OT Evaluation $OT Eval Moderate Complexity: 1 Procedure OT Treatments $Self Care/Home Management : 8-22 mins G-Codes: OT G-codes **NOT FOR INPATIENT CLASS** Functional Assessment Tool Used: clinical judgement Functional Limitation: Self care Self Care Current Status [id number]): At least 1 percent but less than 20 percent impaired, limited or restricted Self Care Goal Status [id number]): At least 1 percent but less than 20 percent impaired, limited or restricted Self Care Discharge Status [phone]): At least 1 percent but less than 20 percent impaired, limited or restricted  Malka So 01/21/2015, 4:04 PM   [phone]

## 2015-01-21 NOTE — Evaluation (Signed)
Physical Therapy Evaluation Patient Details Name: Reginald Edwards MRN: [id number] DOB: 12-Jun-1945 Today's Date: 01/21/2015   History of Present Illness  Mr. Reginald Edwards is a 70 year old right-hand dominant male with a past medical history for HTN, depression, spinal stenosis, gait abnormality, anxiety, HLD, and previous diagnosis refuted of Parkinson's and MS; who presents with complaints of difficulties speaking and weakness on his right hand  Clinical Impression  Patient presents with decreased mobility due to deficits listed in PT problem list.  Has significant history of falls at home.  Feel HHPT indicated to decrease fall risk through education and balance/gait training.  May also benefit from transition to outpatient PT for further gait and possibly orthotic training to reduce falls.  Will follow acutely for skilled PT to address issues unless d/c.    Follow Up Recommendations Home health PT    Equipment Recommendations  None recommended by PT    Recommendations for Other Services       Precautions / Restrictions Precautions Precautions: Fall Precaution Comments: falls frequently at home, no severe injury able to get up unaided Restrictions Weight Bearing Restrictions: No      Mobility  Bed Mobility Overal bed mobility: Modified Independent                Transfers Overall transfer level: Modified independent Equipment used: Rolling walker (2 wheeled)             General transfer comment: pulls up on walker  Ambulation/Gait Ambulation/Gait assistance: Supervision;Min guard Ambulation Distance (Feet): 120 Feet Assistive device: Rolling walker (2 wheeled) Gait Pattern/deviations: Step-through pattern;Decreased step length - right;Shuffle;Trunk flexed;Wide base of support     General Gait Details: progressed R LE with hip adductors occasional steppage, but overall using variety of techinques for R foot clearance, fatigues and flexed trunk with heavy UE support  on walker, needs assist with RW and cues for posture and technique due to used to rollator; fixed posture, but only briefly; assist wtih walker to back up to chair for safety  Stairs            Wheelchair Mobility    Modified Rankin (Stroke Patients Only)       Balance Overall balance assessment: Needs assistance   Sitting balance-Leahy Scale: Good     Standing balance support: No upper extremity supported Standing balance-Leahy Scale: Fair Standing balance comment: able to pull up pants with both hands no UE support, but noted LE weakness and flexed posture                             Pertinent Vitals/Pain Pain Assessment: 0-10 Pain Score: 0-No pain    Home Living Family/patient expects to be discharged to:: Private residence Living Arrangements: Spouse/significant other Available Help at Discharge: Family Type of Home: House Home Access: Stairs to enter Entrance Stairs-Rails: Can reach both Entrance Stairs-Number of Steps: 4 (back of house) Home Layout: One level Home Equipment: Grab bars - tub/shower;Grab bars - toilet;Walker - 4 wheels;Shower seat (has a new electric chair without arm rests ) Additional Comments: Pt reports wanting to increase his balance and strength in order to be able to exercise.    Prior Function Level of Independence: Independent with assistive device(s) (for short periods of time; not on stairs without help)         Comments: Pt reports falling often. He reports one "bad fall" per month due to reported decrease in balance. Pt reports he can  get himself up after he has fallen. Pt denies any broken bones due to falls. Pt has received PT in a rehab facility, but never in home.     Hand Dominance        Extremity/Trunk Assessment   Upper Extremity Assessment: Defer to OT evaluation           Lower Extremity Assessment: RLE deficits/detail;LLE deficits/detail RLE Deficits / Details: AROM WFL except ankle DF due to  foot drop; hip flexion 4/5, knee extension 4+/5 LLE Deficits / Details: AROM WFL strength hip flexion 4/5, knee extension 4+;/5, ankle DF 3+/5     Communication      Cognition Arousal/Alertness: Awake/alert Behavior During Therapy: WFL for tasks assessed/performed Overall Cognitive Status: Within Functional Limits for tasks assessed                      General Comments General comments (skin integrity, edema, etc.): patient relates multiple reasons for falling at home including catching foot on throw rug or door threshold; states doesn't leave his home except on Sunday for church; reports has only had PT at rehab facility in the past    Exercises        Assessment/Plan    PT Assessment Patient needs continued PT services  PT Diagnosis Abnormality of gait;Generalized weakness   PT Problem List Decreased balance;Decreased mobility;Decreased strength;Decreased safety awareness;Decreased knowledge of use of DME;Decreased knowledge of precautions  PT Treatment Interventions DME instruction;Balance training;Gait training;Stair training;Functional mobility training;Therapeutic activities;Therapeutic exercise;Patient/family education   PT Goals (Current goals can be found in the Care Plan section) Acute Rehab PT Goals Patient Stated Goal: To return home PT Goal Formulation: With patient Time For Goal Achievement: 01/28/15 Potential to Achieve Goals: Good    Frequency Min 3X/week   Barriers to discharge        Co-evaluation               End of Session Equipment Utilized During Treatment: Gait belt Activity Tolerance: Patient tolerated treatment well;Patient limited by fatigue Patient left: in chair;with chair alarm set;with call bell/phone within reach      Functional Assessment Tool Used: Clinical Jugement Functional Limitation: Mobility: Walking and moving around Mobility: Walking and Moving Around Current Status [phone]): At least 1 percent but less than 20  percent impaired, limited or restricted Mobility: Walking and Moving Around Goal Status [phone]): At least 1 percent but less than 20 percent impaired, limited or restricted    Time: 0905-0933 PT Time Calculation (min) (ACUTE ONLY): 28 min   Charges:   PT Evaluation $PT Eval Moderate Complexity: 1 Procedure PT Treatments $Gait Training: 8-22 mins   PT G Codes:   PT G-Codes **NOT FOR INPATIENT CLASS** Functional Assessment Tool Used: Clinical Jugement Functional Limitation: Mobility: Walking and moving around Mobility: Walking and Moving Around Current Status [id number]): At least 1 percent but less than 20 percent impaired, limited or restricted Mobility: Walking and Moving Around Goal Status [phone]): At least 1 percent but less than 20 percent impaired, limited or restricted    Reginia Naas 01/21/2015, 10:21 AM  Magda Kiel, PT [phone] 01/21/2015

## 2015-01-21 NOTE — Progress Notes (Signed)
  Echocardiogram 2D Echocardiogram has been performed.  Reginald Edwards 01/21/2015, 4:34 PM

## 2015-01-21 NOTE — Progress Notes (Signed)
Patient refusing the alarms at this time. Wife is at bedside. Wife and patient educated about fall prevention/calling for help. Will continue to monitor/call bell within reach.

## 2015-01-21 NOTE — Care Management Note (Signed)
Case Management Note  Patient Details  Name: Reginald Edwards MRN: [id number] Date of Birth: [birth year]  Subjective/Objective:                  Date- 01-21-15 Initial Assessment Spoke with patient at the bedside along with wife  Introduced self as case Freight forwarder and explained role in discharge planning and how to be reached.  Verified patient lives in  [place] in house with wife.  Verified patient anticipates to go home with spouse at time of discharge and will have limited supervision by wife at this time to best of their knowledge.  Patient has DME cane and electric wheelchair. Expressed potential need for no other DME.  Patient denied needing help with their medication.  Patient is driven by wife to MD appointments.  Verified patient has PCP [name]. Patient states they currently receive [facility] services through no one.   Admission Comments:  Patient admitted with r/o CVA, MRI -.   Carles Collet RN BSN CM [phone]   Action/Plan:  Anticipate DC to home with [facility] PT. Referral made to [facility].  Expected Discharge Date:                  Expected Discharge Plan:  [place]  In-House Referral:     Discharge planning Services  CM Consult  Post Acute Care Choice:  Home Health Choice offered to:  Patient, Spouse  DME Arranged:    DME Agency:     HH Arranged:  PT HH Agency:     Status of Service:  In process, will continue to follow  Medicare Important Message Given:    Date Medicare IM Given:    Medicare IM give by:    Date Additional Medicare IM Given:    Additional Medicare Important Message give by:     If discussed at [place] of Stay Meetings, dates discussed:    Additional Comments:  Carles Collet, RN 01/21/2015, 11:44 AM

## 2015-01-22 DIAGNOSIS — G459 Transient cerebral ischemic attack, unspecified: Secondary | ICD-10-CM | POA: Diagnosis not present

## 2015-01-22 DIAGNOSIS — I10 Essential (primary) hypertension: Secondary | ICD-10-CM | POA: Diagnosis not present

## 2015-01-22 DIAGNOSIS — N289 Disorder of kidney and ureter, unspecified: Secondary | ICD-10-CM | POA: Diagnosis not present

## 2015-01-22 DIAGNOSIS — G451 Carotid artery syndrome (hemispheric): Secondary | ICD-10-CM | POA: Diagnosis not present

## 2015-01-22 LAB — BASIC METABOLIC PANEL
ANION GAP: 5 (ref 5–15)
BUN: 16 mg/dL (ref 6–20)
CO2: 32 mmol/L (ref 22–32)
Calcium: 9.4 mg/dL (ref 8.9–10.3)
Chloride: 104 mmol/L (ref 101–111)
Creatinine, Ser: 1.4 mg/dL — ABNORMAL HIGH (ref 0.61–1.24)
GFR calc Af Amer: 59 mL/min — ABNORMAL LOW (ref >60)
GFR, EST NON AFRICAN AMERICAN: 50 mL/min — AB (ref >60)
GLUCOSE: 88 mg/dL (ref 65–99)
POTASSIUM: 3.8 mmol/L (ref 3.5–5.1)
Sodium: 141 mmol/L (ref 135–145)

## 2015-01-22 LAB — HEMOGLOBIN A1C
HEMOGLOBIN A1C: 6 % — AB (ref 4.8–5.6)
MEAN PLASMA GLUCOSE: 126 mg/dL

## 2015-01-22 LAB — CERULOPLASMIN: Ceruloplasmin: 18.6 mg/dL (ref 16.0–31.0)

## 2015-01-22 NOTE — Progress Notes (Signed)
Boyd Kerbs to be D/C'd Home per MD order.  Discussed with the patient and all questions fully answered.  VSS, Skin clean, dry and intact without evidence of skin break down, no evidence of skin tears noted. IV catheter discontinued intact. Site without signs and symptoms of complications. Dressing and pressure applied.  An After Visit Summary was printed and given to the patient. Patient received prescription.  D/c education completed with patient/family including follow up instructions, medication list, d/c activities limitations if indicated, with other d/c instructions as indicated by MD - patient able to verbalize understanding, all questions fully answered.   Patient instructed to return to ED, call 911, or call MD for any changes in condition.   Patient escorted via [place], and D/C home via private auto.  Malcolm Metro 01/22/2015 11:31 AM

## 2015-01-22 NOTE — Care Management Obs Status (Signed)
[place] NOTIFICATION   Patient Details  Name: Reginald Edwards MRN: [id number] Date of Birth: 08-14-1945   Medicare Observation Status Notification Given:  Yes    Carles Collet, RN 01/22/2015, 11:00 AM

## 2015-01-22 NOTE — Progress Notes (Signed)
STROKE TEAM PROGRESS NOTE   HISTORY OF PRESENT ILLNESS Reginald Edwards is an 70 y.o. male with a history of hypertension, hyperlipidemia, depression and anxiety as well as an equivocal history of demyelinating disorder, brought to the emergency room and code stroke status following acute onset of difficulty and weakness 01/20/2015 at 715p (LKW). He had difficulty formulating words and his speech was slurred. He had diffuse weakness, but proportionate weakness involving his right leg. No facial droop was described. There is an equivocal history of TIAs as well. CT scan of his head showed no acute intracranial abnormality. Chronic small vessel ischemic changes were noted. Patient has been taking aspirin daily. Deficits resolved essentially in route to the ED. NIH stroke score was 0 at the time of this evaluation. Patient was not administered TPA secondary to episode resolved. He was admitted for further evaluation and treatment.   SUBJECTIVE (INTERVAL HISTORY) Overall he feels his condition is stable.    OBJECTIVE Temp:  [97.8 F (36.6 C)-98.3 F (36.8 C)] 97.8 F (36.6 C) (01/20 0458) Pulse Rate:  [57-66] 66 (01/20 0458) Cardiac Rhythm:  [-]  Resp:  [18-20] 20 (01/20 0458) BP: (129-164)/(76-80) 146/77 mmHg (01/20 0458) SpO2:  [93 %-97 %] 93 % (01/20 0458)  CBC:   Recent Labs Lab 01/20/15 2011 01/20/15 2017  WBC  --  8.9  NEUTROABS  --  6.4  HGB 15.3 14.7  HCT 45.0 43.1  MCV  --  93.1  PLT  --  [id number]    Basic Metabolic Panel:   Recent Labs Lab 01/21/15 0550 01/22/15 0523  NA 143 141  K 3.7 3.8  CL 105 104  CO2 32 32  GLUCOSE 85 88  BUN 19 16  CREATININE 1.43* 1.40*  CALCIUM 9.5 9.4    Lipid Panel:     Component Value Date/Time   CHOL 112 01/21/2015 0550   TRIG 226* 01/21/2015 0550   HDL 34* 01/21/2015 0550   CHOLHDL 3.3 01/21/2015 0550   VLDL 45* 01/21/2015 0550   LDLCALC 33 01/21/2015 0550   HgbA1c:  Lab Results  Component Value Date   HGBA1C 6.0*  01/20/2015   Urine Drug Screen: No results found for: LABOPIA, COCAINSCRNUR, LABBENZ, AMPHETMU, THCU, LABBARB    IMAGING  Dg Chest 2 View 01/21/2015   Mild bibasilar atelectasis.   Ct Head Wo Contrast 01/20/2015  No evidence of acute intracranial hemorrhage or obvious change in extensive chronic small vessel ischemic changes.   MRI HEAD 01/20/2015   No acute intracranial process, specifically no acute ischemia. Moderate to severe white matter changes in a pattern most compatible with chronic small vessel ischemic disease. Bilateral basal ganglia edema suggesting sequelae of hypertension/PRES, metabolic disorder.   MRA HEAD 01/20/2015   Negative.    PHYSICAL EXAM Pleasant middle-aged Caucasian male currently not in distress. . Afebrile. Head is nontraumatic. Neck is supple without bruit.    Cardiac exam no murmur or gallop. Lungs are clear to auscultation. Distal pulses are well felt. Neurological Exam :  Awake alert oriented 3. Diminished attention, registration and recall. No aphasia or apraxia dysarthria. Extraocular movements show slight restriction of left lateral gaze and slowness saccades to the left. No nystagmus. No afferent pupillary defect. Vision acuity seems adequate. Fundi were not visualized. Face is symmetric without weakness. Tongue is midline. Motor system exam revealed no upper or lower extremity drift. Mild weakness of ankle dorsiflexors right more than left. Slight diminished sensation in the right foot from ankle down. Coordination is slow  but accurate. Gait was not tested. ASSESSMENT/PLAN Mr. Reginald Edwards is a 70 y.o. male with history of hypertension, hyperlipidemia, depression, anxiety and an equivocal history of demyelinating disorder presenting with transient right sided weakness and speech difficulty. He did not receive IV t-PA due to episodes resolved.   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  2D Echo Left ventricle: The cavity size was normal. Wall thickness was normal. Systolic function was normal. The estimated ejection fraction was in the range of 60% to 65%. Wall motion was normal; there were no regional wall motion abnormalitiesLDL 33  HgbA1c 6.0  Lovenox 40 mg sq daily for VTE prophylaxis Diet Heart Room service appropriate?: Yes; Fluid consistency:: Thin Diet - low sodium heart healthy  aspirin 325 mg daily prior to admission, now on aspirin 325 mg daily. Recommend change to Plavix. Order written.   Patient counseled to be compliant with his antithrombotic medications  Ongoing aggressive stroke risk factor management  Therapy recommendations:  HH PT  Disposition:  Return home  Follow up with Dr. Leta Baptist in 4 weeks. Requested.  Hypertension  Stable  Hyperlipidemia  Home meds:  lipitor 20, resumed in hospital  LDL 33, goal < 70  Continue statin at discharge  Other Stroke Risk Factors  Advanced age  Obesity, Body mass index is 31.79 kg/(m^2).   Hx TIA  Mild obstructive sleep apnea, declined CPAP  Other Active Problems  Chronic hearing loss  Cr 1.43  Ne[facility] work up at [facility] ([place] [facility] [place]) feel pt does not have MS. 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 I have made any additions or clarifications directly to the above note.  . I had a long discussion with the patient   and discussed plan of care and answered questions. Recommend discontinue aspirin and change to Plavix for secondary stroke prevention. Okay will sign off. Can call for questions. Follow-up with Dr.Penumalli In the clinic  Antony Contras, MD Medical Director [place] Pager: [phone] 01/22/2015 1:18 PM    To contact Stroke Continuity provider, please refer to [email or web address] After hours, contact General Neurology

## 2015-01-25 DIAGNOSIS — G8191 Hemiplegia, unspecified affecting right dominant side: Secondary | ICD-10-CM | POA: Diagnosis not present

## 2015-01-25 DIAGNOSIS — M21371 Foot drop, right foot: Secondary | ICD-10-CM | POA: Diagnosis not present

## 2015-01-25 DIAGNOSIS — G709 Myoneural disorder, unspecified: Secondary | ICD-10-CM | POA: Diagnosis not present

## 2015-01-25 DIAGNOSIS — F329 Major depressive disorder, single episode, unspecified: Secondary | ICD-10-CM | POA: Diagnosis not present

## 2015-01-25 DIAGNOSIS — M4306 Spondylolysis, lumbar region: Secondary | ICD-10-CM | POA: Diagnosis not present

## 2015-01-25 DIAGNOSIS — I10 Essential (primary) hypertension: Secondary | ICD-10-CM | POA: Diagnosis not present

## 2015-01-26 ENCOUNTER — Ambulatory Visit (INDEPENDENT_AMBULATORY_CARE_PROVIDER_SITE_OTHER): Payer: Medicare Other | Admitting: Family Medicine

## 2015-01-26 ENCOUNTER — Telehealth: Payer: Self-pay

## 2015-01-26 VITALS — BP 122/72 | HR 111 | Temp 98.5°F | Resp 17 | Ht 67.0 in | Wt 210.0 lb

## 2015-01-26 DIAGNOSIS — M545 Low back pain: Secondary | ICD-10-CM | POA: Diagnosis not present

## 2015-01-26 DIAGNOSIS — R4789 Other speech disturbances: Secondary | ICD-10-CM | POA: Diagnosis not present

## 2015-01-26 DIAGNOSIS — R2681 Unsteadiness on feet: Secondary | ICD-10-CM

## 2015-01-26 DIAGNOSIS — R32 Unspecified urinary incontinence: Secondary | ICD-10-CM

## 2015-01-26 DIAGNOSIS — R531 Weakness: Secondary | ICD-10-CM

## 2015-01-26 DIAGNOSIS — Z8673 Personal history of transient ischemic attack (TIA), and cerebral infarction without residual deficits: Secondary | ICD-10-CM | POA: Diagnosis not present

## 2015-01-26 DIAGNOSIS — M6289 Other specified disorders of muscle: Secondary | ICD-10-CM | POA: Diagnosis not present

## 2015-01-26 NOTE — Telephone Encounter (Signed)
I do not see where the order is in his chart. He definitely could use the PT so I fine with that order.

## 2015-01-26 NOTE — Progress Notes (Addendum)
Subjective:  This chart was scribed for Merri Ray MD, by Tamsen Roers, at [facility].  This patient was seen in room 13 and the patient's care was started at 4:01 PM.    Patient ID: Reginald Edwards, male    DOB: 12/15/1945, 70 y.o.   MRN: [id number] Chief Complaint  Patient presents with  . Hospitalization Follow-up    TIA last wednes    HPI  HPI Comments: Reginald Edwards is a 70 y.o. male who presents to the [facility] for a follow up. Patient has not had any new symptoms since he left the hospital but feels like "everything is getting worse".  He is having issues with his balance and feels like he is tripping around more often.  He has had balance issues for a while as well as muscle atrophy (before the TIA).  Patient has worsening right sided weakness which was also present before his TIA.  He thinks that his weakness may have gotten worse (right leg and arm) because he was sitting in the hospital bed for four days.  He was also having spasms in his right arm which he has had before his TIA.  The weakness in his right leg has increased since his hospitalization.   He is having more difficulty speaking/mubling and finding the correct word since he has left the hospital. Per wife, he has always had some difficulty with this but it has increased since the hospitalization.  Patient sleeps most of the day and does not eat proper breakfast.   He has had an initial evaluation by his PT yesterday.  Patient's fall risk was 3/10.  He has not fallen since he left the hospital.   Back pain: He does not want to take Naprosyn any longer as he was just started on Plavix and was recommended to get a new prescription/change his medication.  He is having worsening pain in his back and has had this issue for a long time. He also has weakness in his gluteus,hamstings and legs.  The pain comes on with extra exertion and doesn't feel like he can stand for a long  period of time.  Patient had urinary incontinence in the past before the TIA.  Depression: Patient is taking Wellbutrin.  He feels more depressed lately and has a psychiatrist (Dr. Toy Care).  He denies any thoughts of suicide. Patient does not have a therapist.     ------ Patient's PCP is Dr. [name].   He has a history of multiple medical problems. He was admitted January 18th through January 20th for a TIA.  After presenting with right sided weakness and dysarthria , MRI of his brain without CVA, MRA unremarkable.  carotid doppler without significant stenosis. 2d echo without embolic source. Changed from aspirin to Plavix on discharge and planned for follow up with neurology in the next few weeks. He was also started on amlodipine for blood pressure and stopped aspirin and HCTZ. Pending issues for follow up were A1 c, ceruloplasmin level and repeat CBC and BMET. That level was normal at 8.6 (19th).  A1C was 6.0 on the 18th.    Follow up electrolytes today with creatinine 1.4 GFR= 50 on January 20th.  Increased from 1.01 , 1 month ago.    Discharge hemoglobin was 1.4.    He has a history of spinal stenosis, CT of lumbar spine October 14 under care of Dr. Ellene Route.    Patient Active Problem List  Diagnosis Date Noted  . Memory loss 01/21/2015  . Renal insufficiency   . TIA (transient ischemic attack) 01/20/2015  . Right sided weakness 01/20/2015  . Dysarthria 01/20/2015  . H/O malignant neoplasm of skin 10/02/2013  . Postural tremor 08/08/2013  . 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 Cervical disc disorder with radiculopathy of cervical region 08/08/2013  . 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 Depression 08/08/2013  . Right foot drop 12/18/2012  . Spinal stenosis of lumbar region 03/20/2012  . Lumbar radiculopathy 03/20/2012  . BETremor 12/06/2011  . Chest discomfort 10/02/2011  . Exertional dyspnea 10/02/2011  . Mood disorder ([place]) 12/23/2010  . Hyperlipidemia 12/23/2010  . Essential  hypertension, benign 12/23/2010  . Hearing loss 12/23/2010   Past Medical History  Diagnosis Date  . Hyperlipemia   . Essential hypertension   . Mood disorder ([place])   . Hearing loss   . Arthritis   . Neuromuscular disorder ([place])   . Depression   . Anxiety   . DJD (degenerative joint disease) of lumbar spine   . Cancer ([place])     skin   . TIA (transient ischemic attack)    Past Surgical History  Procedure Laterality Date  . Eye surgery    . Tonsillectomy    . Shoulder surgery     Allergies  Allergen Reactions  . Clorazepate     Makes him extremely groggy  . Lisinopril     REACTION: constant cough,irritation  . Vicodin [Hydrocodone-Acetaminophen]     Vomiting, drowsy, hallucinations   Prior to Admission medications   Medication Sig Start Date End Date Taking? Authorizing Provider  ALPRAZolam (XANAX) 0.25 MG tablet Take 0.25 mg by mouth 3 (three) times daily.    Yes Historical Provider, MD  amLODipine (NORVASC) 2.5 MG tablet Take 1 tablet (2.5 mg total) by mouth daily. 01/21/15  Yes Shanker Kristeen Mans, MD  amphetamine-dextroamphetamine (ADDERALL) 20 MG tablet Take 20 mg by mouth 3 (three) times daily.     Yes Historical Provider, MD  atorvastatin (LIPITOR) 20 MG tablet TAKE 1 TABLET BY MOUTH EVERY DAY Patient taking differently: Take 20 mg by mouth daily at 6 PM. TAKE 1 TABLET BY MOUTH EVERY DAY 11/25/14  Yes Leandrew Koyanagi, MD  buPROPion [facility] SR) 200 MG 12 hr tablet Take 200 mg by mouth 3 (three) times daily.   Yes Historical Provider, MD  calcium carbonate (TUMS - DOSED IN MG ELEMENTAL CALCIUM) 500 MG chewable tablet Chew 1 tablet by mouth 3 (three) times daily.   Yes Historical Provider, MD  cholecalciferol (VITAMIN D) 1000 units tablet Take 5,000 Units by mouth daily.   Yes Historical Provider, MD  clopidogrel (PLAVIX) 75 MG tablet Take 1 tablet (75 mg total) by mouth daily. 01/21/15  Yes Shanker Kristeen Mans, MD  diclofenac sodium (VOLTAREN) 1 % GEL Apply 2 g  topically daily as needed (pain).   Yes Historical Provider, MD  losartan (COZAAR) 50 MG tablet Take 1 tablet (50 mg total) by mouth daily. 01/21/15  Yes Shanker Kristeen Mans, MD  Multiple Vitamin (MULTIVITAMIN) tablet Take 1 tablet by mouth daily.   Yes Historical Provider, MD  tamsulosin (FLOMAX) 0.4 MG CAPS capsule Take 0.4 mg by mouth daily.    Yes Historical Provider, MD  temazepam (RESTORIL) 15 MG capsule Take 15 mg by mouth at bedtime as needed for sleep. X 2   Yes Historical Provider, MD  traZODone (DESYREL) 50 MG tablet Take 50 mg by mouth at bedtime.   Yes  Historical Provider, MD  propranolol ER (INDERAL LA) 80 MG 24 hr capsule Take 1 capsule (80 mg total) by mouth daily. Patient not taking: Reported on 01/20/2015 11/25/14   Leandrew Koyanagi, MD   Social History   Social History  . Marital Status: Married    Spouse Name: Diane  . Number of Children: 5  . Years of Education: college   Occupational History  . Retired    Social History Main Topics  . Smoking status: Never Smoker   . Smokeless tobacco: Never Used  . Alcohol Use: No  . Drug Use: No  . Sexual Activity: No   Other Topics Concern  . Not on file   Social History Narrative   Patient lives at home with spouse.   Caffeine Use: rarely;med        Review of Systems  Constitutional: Negative for fever and chills.  Eyes: Negative for redness and itching.  Respiratory: Negative for cough and choking.   Gastrointestinal: Negative for nausea and vomiting.  Musculoskeletal: Positive for back pain.  Neurological: Positive for weakness.       Objective:   Physical Exam  Constitutional:  Slow speech, overall intelligible.   Eyes: Pupils are equal, round, and reactive to light.  No nystagmus.   Musculoskeletal:  Equal grip strength.  Gross testing: strength is equal bilaterally with seated exam.    Neurological:  Normal facial movements.     Filed Vitals:   01/26/15 1407  BP: 122/72  Pulse: 111  Temp:  98.5 F (36.9 C)  TempSrc: Oral  Resp: 17  Height: 5\' 7"  (1.702 m)  Weight: 210 lb (95.255 kg)  SpO2: 97%         Assessment & Plan:   Reginald Edwards is a 70 y.o. male Low back pain, unspecified back pain laterality, with sciatica presence unspecified  Urinary incontinence, unspecified incontinence type  Word finding difficulty  Right sided weakness  Unsteadiness  Hx of TIA (transient ischemic attack) and stroke  History of chronic low back pain with spinal stenosis, recent TIA, other complicated past medical history. Recent admission and evaluation for TIA, with reassuring MRI, MRA, echo, and carotid Dopplers. Now with progressive subjective right-sided weakness, increased spasticity of the right arm, and word finding difficulty since discharge from the hospital. Additionally feels more unsteady/balance difficulty since discharge.   -Discussed concern of progressive neurologic insult/deficits since discharge, although apparently no acute findings on testing in hospital during TIA workup.  May need further neuroimaging and metabolic eval.   -additionally with his increasing back pain, and urinary incontinence (although he has had this before), discussed may need further imaging such as MRI to rule out progressive weakness from lumbar spine.  -after repeated discussion - he declines ER evaluation tonight. He was notified of risks of progression of symptoms, possible new stroke/TIA, metabolic cause of symptoms, and need for neuorimaging as well as possibility of progressive neurologic deficit/insult or death without treatment.  Understanding expressed.  Wife also present for discussion  - I did not prescribe him a stronger medication for his back pain tonight given above neurologic symptoms and concern for masking progression of illness with these medications. Again advised to be seen in the emergency room.  -I suspect there is some component of depression of his chronic illness, and  current lack of desire for emergency room evaluation. He has not followed up with therapist in the past, denies SI currently. Recommended he follow up with Dr. [name] and his psychiatrist  to discuss this further and follow-up with therapist. Other numbers can be provided if needed.    Meds ordered this encounter  Medications  . cholecalciferol (VITAMIN D) 1000 units tablet    Sig: Take 5,000 Units by mouth daily.   Patient Instructions  As recommended in the office, I do believe you need to be seen  in the emergency room tonight due to your progressive weakness, unsteadiness, and word finding issues.  I am concerned that this could be possibly stroke related or other neurologic insult, or possible metabolic cause that they can further evaluate in the emergency room. Also for your increasing back pain, further imaging such as MRI may be needed through the emergency room, as well as discussion of appropriate pain treatment. I did not write any narcotic pain medicine here in the office today giving your current weakness, unsteadiness, and word finding issues, as I feel that may increase your neurologic symptoms and make it difficult to determine if you are having neurologic worsening. If you change your mind tonight, go immediately to the emergency room or call 911. I will also advise your neurologist office of your current symptoms so that they may contact you in follow-up. Please follow-up with Dr. [name] to discuss the depression further, but I also recommend following up with therapist as he had recommended in the past.     Patient has declined to go to the emergency room tonight after he has been notified of risks of progression of symptoms,  Possible new stroke/TIA, metabolic cause of symptoms, and need for neuoimaging as well as possibility of progressive neurologic deficit/insult or death without treatment.  Understanding expressed.  Wife also present for discussion

## 2015-01-26 NOTE — Telephone Encounter (Signed)
Remo Lipps from [facility] needs to confirm the order of pt 3 times for 4weeks, the 2time for 4 weeks

## 2015-01-26 NOTE — Patient Instructions (Addendum)
As recommended in the office, I do believe you need to be seen  in the emergency room tonight due to your progressive weakness, unsteadiness, and word finding issues.  I am concerned that this could be possibly stroke related or other neurologic insult, or possible metabolic cause that they can further evaluate in the emergency room. Also for your increasing back pain, further imaging such as MRI may be needed through the emergency room, as well as discussion of appropriate pain treatment. I did not write any narcotic pain medicine here in the office today giving your current weakness, unsteadiness, and word finding issues, as I feel that may increase your neurologic symptoms and make it difficult to determine if you are having neurologic worsening. If you change your mind tonight, go immediately to the emergency room or call 911. I will also advise your neurologist office of your current symptoms so that they may contact you in follow-up. Please follow-up with Dr. [name] to discuss the depression further, but I also recommend following up with therapist as he had recommended in the past.

## 2015-01-27 NOTE — Telephone Encounter (Signed)
Left verbal order on Reginald Edwards's VM. I called Reginald Edwards and they have him set up to come in to see Dr. Ian Edwards tomorrow at 2:30pm. Reginald Edwards tried to call Reginald Edwards this [email or web address] they had an opening this morning but Reginald Edwards did not answer the phone or return call. FYI Dr. Carlota Raspberry. I offered to try to contact Reginald Edwards throughout the day to advise him of appt tomorrow. Please send message back to me for a reminder to call. Thanks.

## 2015-01-28 ENCOUNTER — Encounter: Payer: Self-pay | Admitting: Diagnostic Neuroimaging

## 2015-01-28 ENCOUNTER — Ambulatory Visit (INDEPENDENT_AMBULATORY_CARE_PROVIDER_SITE_OTHER): Payer: Medicare Other | Admitting: Diagnostic Neuroimaging

## 2015-01-28 VITALS — BP 154/84 | HR 96

## 2015-01-28 DIAGNOSIS — G4730 Sleep apnea, unspecified: Secondary | ICD-10-CM | POA: Diagnosis not present

## 2015-01-28 DIAGNOSIS — R413 Other amnesia: Secondary | ICD-10-CM

## 2015-01-28 DIAGNOSIS — M4806 Spinal stenosis, lumbar region: Secondary | ICD-10-CM | POA: Diagnosis not present

## 2015-01-28 DIAGNOSIS — F329 Major depressive disorder, single episode, unspecified: Secondary | ICD-10-CM

## 2015-01-28 DIAGNOSIS — F32A Depression, unspecified: Secondary | ICD-10-CM

## 2015-01-28 DIAGNOSIS — M48061 Spinal stenosis, lumbar region without neurogenic claudication: Secondary | ICD-10-CM
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
## 2015-01-28 NOTE — Patient Instructions (Signed)
-   consider CPAP treatment of OSA - consider second opinion for lumbar spine disease at [facility] (Dr. Jenne Campus) - continue plavix, BP control, statin for secondary stroke prevention - continue psychiatry treatment - check labs today for memory decline - home palliative care consult for advanced care planning and goals of care

## 2015-01-29 ENCOUNTER — Telehealth: Payer: Self-pay

## 2015-01-29 ENCOUNTER — Telehealth: Payer: Self-pay | Admitting: *Deleted

## 2015-01-29 DIAGNOSIS — F329 Major depressive disorder, single episode, unspecified: Secondary | ICD-10-CM | POA: Diagnosis not present

## 2015-01-29 DIAGNOSIS — G709 Myoneural disorder, unspecified: Secondary | ICD-10-CM | POA: Diagnosis not present

## 2015-01-29 DIAGNOSIS — M4306 Spondylolysis, lumbar region: Secondary | ICD-10-CM | POA: Diagnosis not present

## 2015-01-29 DIAGNOSIS — G8191 Hemiplegia, unspecified affecting right dominant side: Secondary | ICD-10-CM | POA: Diagnosis not present

## 2015-01-29 DIAGNOSIS — M21371 Foot drop, right foot: Secondary | ICD-10-CM | POA: Diagnosis not present

## 2015-01-29 DIAGNOSIS — I10 Essential (primary) hypertension: Secondary | ICD-10-CM | POA: Diagnosis not present

## 2015-01-29 LAB — VITAMIN B12: VITAMIN B 12: 812 pg/mL (ref 211–946)

## 2015-01-29 LAB — TSH: TSH: 3.26 u[IU]/mL (ref 0.450–4.500)

## 2015-01-29 NOTE — Telephone Encounter (Signed)
Bess Kinds from [facility] called to inform Dr Everlene Farrier that the patient's blood pressure was elevated this morning at 170/88

## 2015-01-29 NOTE — Telephone Encounter (Signed)
Spoke with patient and informed him his lab results are normal. He verbalized understanding, appreciation. 

## 2015-02-01 DIAGNOSIS — M4306 Spondylolysis, lumbar region: Secondary | ICD-10-CM | POA: Diagnosis not present

## 2015-02-01 DIAGNOSIS — I10 Essential (primary) hypertension: Secondary | ICD-10-CM | POA: Diagnosis not present

## 2015-02-01 DIAGNOSIS — M21371 Foot drop, right foot: Secondary | ICD-10-CM | POA: Diagnosis not present

## 2015-02-01 DIAGNOSIS — F329 Major depressive disorder, single episode, unspecified: Secondary | ICD-10-CM | POA: Diagnosis not present

## 2015-02-01 DIAGNOSIS — G709 Myoneural disorder, unspecified: Secondary | ICD-10-CM | POA: Diagnosis not present

## 2015-02-01 DIAGNOSIS — G8191 Hemiplegia, unspecified affecting right dominant side: Secondary | ICD-10-CM | POA: Diagnosis not present

## 2015-02-03 DIAGNOSIS — G8191 Hemiplegia, unspecified affecting right dominant side: Secondary | ICD-10-CM | POA: Diagnosis not present

## 2015-02-03 DIAGNOSIS — R531 Weakness: Secondary | ICD-10-CM | POA: Diagnosis not present

## 2015-02-03 DIAGNOSIS — I10 Essential (primary) hypertension: Secondary | ICD-10-CM | POA: Diagnosis not present

## 2015-02-03 DIAGNOSIS — G709 Myoneural disorder, unspecified: Secondary | ICD-10-CM | POA: Diagnosis not present

## 2015-02-03 DIAGNOSIS — M4306 Spondylolysis, lumbar region: Secondary | ICD-10-CM | POA: Diagnosis not present

## 2015-02-03 DIAGNOSIS — M21371 Foot drop, right foot: Secondary | ICD-10-CM | POA: Diagnosis not present

## 2015-02-03 DIAGNOSIS — F329 Major depressive disorder, single episode, unspecified: Secondary | ICD-10-CM | POA: Diagnosis not present

## 2015-02-09 DIAGNOSIS — F329 Major depressive disorder, single episode, unspecified: Secondary | ICD-10-CM | POA: Diagnosis not present

## 2015-02-09 DIAGNOSIS — I10 Essential (primary) hypertension: Secondary | ICD-10-CM | POA: Diagnosis not present

## 2015-02-09 DIAGNOSIS — M4306 Spondylolysis, lumbar region: Secondary | ICD-10-CM | POA: Diagnosis not present

## 2015-02-09 DIAGNOSIS — G8191 Hemiplegia, unspecified affecting right dominant side: Secondary | ICD-10-CM | POA: Diagnosis not present

## 2015-02-09 DIAGNOSIS — G709 Myoneural disorder, unspecified: Secondary | ICD-10-CM | POA: Diagnosis not present

## 2015-02-09 DIAGNOSIS — M21371 Foot drop, right foot: Secondary | ICD-10-CM | POA: Diagnosis not present

## 2015-02-10 DIAGNOSIS — F329 Major depressive disorder, single episode, unspecified: Secondary | ICD-10-CM | POA: Diagnosis not present

## 2015-02-10 DIAGNOSIS — G8191 Hemiplegia, unspecified affecting right dominant side: Secondary | ICD-10-CM | POA: Diagnosis not present

## 2015-02-10 DIAGNOSIS — M21371 Foot drop, right foot: Secondary | ICD-10-CM | POA: Diagnosis not present

## 2015-02-10 DIAGNOSIS — G709 Myoneural disorder, unspecified: Secondary | ICD-10-CM | POA: Diagnosis not present

## 2015-02-10 DIAGNOSIS — M4306 Spondylolysis, lumbar region: Secondary | ICD-10-CM | POA: Diagnosis not present

## 2015-02-10 DIAGNOSIS — I10 Essential (primary) hypertension: Secondary | ICD-10-CM | POA: Diagnosis not present

## 2015-02-12 DIAGNOSIS — G8191 Hemiplegia, unspecified affecting right dominant side: Secondary | ICD-10-CM | POA: Diagnosis not present

## 2015-02-12 DIAGNOSIS — M4306 Spondylolysis, lumbar region: Secondary | ICD-10-CM | POA: Diagnosis not present

## 2015-02-12 DIAGNOSIS — M21371 Foot drop, right foot: Secondary | ICD-10-CM | POA: Diagnosis not present

## 2015-02-12 DIAGNOSIS — F329 Major depressive disorder, single episode, unspecified: Secondary | ICD-10-CM | POA: Diagnosis not present

## 2015-02-12 DIAGNOSIS — I10 Essential (primary) hypertension: Secondary | ICD-10-CM | POA: Diagnosis not present

## 2015-02-12 DIAGNOSIS — G709 Myoneural disorder, unspecified: Secondary | ICD-10-CM | POA: Diagnosis not present

## 2015-02-15 ENCOUNTER — Other Ambulatory Visit: Payer: Self-pay | Admitting: Emergency Medicine

## 2015-02-15 DIAGNOSIS — G709 Myoneural disorder, unspecified: Secondary | ICD-10-CM | POA: Diagnosis not present

## 2015-02-15 DIAGNOSIS — I10 Essential (primary) hypertension: Secondary | ICD-10-CM | POA: Diagnosis not present

## 2015-02-15 DIAGNOSIS — F329 Major depressive disorder, single episode, unspecified: Secondary | ICD-10-CM | POA: Diagnosis not present

## 2015-02-15 DIAGNOSIS — M21371 Foot drop, right foot: Secondary | ICD-10-CM | POA: Diagnosis not present

## 2015-02-15 DIAGNOSIS — M4306 Spondylolysis, lumbar region: Secondary | ICD-10-CM | POA: Diagnosis not present

## 2015-02-15 DIAGNOSIS — G8191 Hemiplegia, unspecified affecting right dominant side: Secondary | ICD-10-CM | POA: Diagnosis not present

## 2015-02-16 ENCOUNTER — Telehealth: Payer: Self-pay

## 2015-02-16 DIAGNOSIS — M21371 Foot drop, right foot: Secondary | ICD-10-CM | POA: Diagnosis not present

## 2015-02-16 DIAGNOSIS — I10 Essential (primary) hypertension: Secondary | ICD-10-CM | POA: Diagnosis not present

## 2015-02-16 DIAGNOSIS — F329 Major depressive disorder, single episode, unspecified: Secondary | ICD-10-CM | POA: Diagnosis not present

## 2015-02-16 DIAGNOSIS — G709 Myoneural disorder, unspecified: Secondary | ICD-10-CM | POA: Diagnosis not present

## 2015-02-16 DIAGNOSIS — G8191 Hemiplegia, unspecified affecting right dominant side: Secondary | ICD-10-CM | POA: Diagnosis not present

## 2015-02-16 DIAGNOSIS — M4306 Spondylolysis, lumbar region: Secondary | ICD-10-CM | POA: Diagnosis not present

## 2015-02-16 NOTE — Telephone Encounter (Signed)
Skilled nursing 2 x wk 2  ; 2 x wk 3 wks  Depression, medication management and coping skills.   Requesting permission to see patient for the above reasons.  They will send over an order via fax.  Dr. [name]  [phone]

## 2015-02-17 DIAGNOSIS — M21371 Foot drop, right foot: Secondary | ICD-10-CM | POA: Diagnosis not present

## 2015-02-17 DIAGNOSIS — I10 Essential (primary) hypertension: Secondary | ICD-10-CM | POA: Diagnosis not present

## 2015-02-17 DIAGNOSIS — G709 Myoneural disorder, unspecified: Secondary | ICD-10-CM | POA: Diagnosis not present

## 2015-02-17 DIAGNOSIS — M4306 Spondylolysis, lumbar region: Secondary | ICD-10-CM | POA: Diagnosis not present

## 2015-02-17 DIAGNOSIS — F329 Major depressive disorder, single episode, unspecified: Secondary | ICD-10-CM | POA: Diagnosis not present

## 2015-02-17 DIAGNOSIS — G8191 Hemiplegia, unspecified affecting right dominant side: Secondary | ICD-10-CM | POA: Diagnosis not present

## 2015-02-18 ENCOUNTER — Other Ambulatory Visit: Payer: Self-pay

## 2015-02-18 MED ORDER — LOSARTAN POTASSIUM 50 MG PO TABS: 50.0000 mg | ORAL_TABLET | Freq: Every day | ORAL | Status: DC

## 2015-02-18 NOTE — Telephone Encounter (Signed)
Patient's spouse is calling because the pharmacy is asking for prior authorization for Plavix, losartan, and Norvasc.

## 2015-02-19 DIAGNOSIS — M21371 Foot drop, right foot: Secondary | ICD-10-CM | POA: Diagnosis not present

## 2015-02-19 DIAGNOSIS — I10 Essential (primary) hypertension: Secondary | ICD-10-CM | POA: Diagnosis not present

## 2015-02-19 DIAGNOSIS — F329 Major depressive disorder, single episode, unspecified: Secondary | ICD-10-CM | POA: Diagnosis not present

## 2015-02-19 DIAGNOSIS — G709 Myoneural disorder, unspecified: Secondary | ICD-10-CM | POA: Diagnosis not present

## 2015-02-19 DIAGNOSIS — M4306 Spondylolysis, lumbar region: Secondary | ICD-10-CM | POA: Diagnosis not present

## 2015-02-19 DIAGNOSIS — G8191 Hemiplegia, unspecified affecting right dominant side: Secondary | ICD-10-CM | POA: Diagnosis not present

## 2015-02-22 ENCOUNTER — Telehealth: Payer: Self-pay | Admitting: *Deleted

## 2015-02-22 MED ORDER — AMLODIPINE BESYLATE 2.5 MG PO TABS: 2.5000 mg | ORAL_TABLET | Freq: Every day | ORAL | Status: DC

## 2015-02-22 MED ORDER — AMLODIPINE BESYLATE 2.5 MG PO TABS: 2.5000 mg | ORAL_TABLET | Freq: Every day | ORAL | Status: AC

## 2015-02-22 MED ORDER — LOSARTAN POTASSIUM 50 MG PO TABS: 50.0000 mg | ORAL_TABLET | Freq: Every day | ORAL | Status: DC

## 2015-02-22 NOTE — Telephone Encounter (Signed)
I will pend the 90 day Rx of amlodipine to [facility] for Dr Doolittle's review.

## 2015-02-22 NOTE — Telephone Encounter (Signed)
Called and spoke to wife. Advised her that I have not gotten any notices from pharm about any meds needing a PA. Advised her that I had RFd losartan to [facility] when req'd by them last week, and see that neurologist wrote Rx for plavix. She stated that he has been without these meds for several days. I will send in a 7 day RF to local pharm also of the losartan. Can we get RFs of 90 days to [facility] and also 7 days to HT at [facility] of the amlodipine? We have not Rxd for pt before, he was started on it in the hosp, but Dr Carlota Raspberry did discuss with him at f/up visit. Copied OV notes here: "He was also started on amlodipine for blood pressure and stopped aspirin and HCTZ".  I will pend the 7 day to be sent to [facility], but then pharm will have to be changed and 90 day sent to [facility].

## 2015-02-22 NOTE — Telephone Encounter (Signed)
Will forward to Dr. [name] for long-term prescription, but I did sign the seven-day prescription.

## 2015-02-22 NOTE — Telephone Encounter (Signed)
Spoke with patient and informed him that Dr Leta Baptist would like him to go to PCP for med refill on Clopidogrel. Patient stated his PCP retired , and he doesn't want to see the dr he saw at that office in Jan. He stated he is moving mid March. Advised that Dr Leta Baptist may refill med x 2 months so that he can move and get established with PCP in his new residence.  He stated "that would be good." Will discuss with Dr Leta Baptist.  Refill for Clopidogrel x 2 months successfully faxed to [facility] at patient's request. He did not want it faxed to local pharmacy.

## 2015-02-23 DIAGNOSIS — F329 Major depressive disorder, single episode, unspecified: Secondary | ICD-10-CM | POA: Diagnosis not present

## 2015-02-23 DIAGNOSIS — I10 Essential (primary) hypertension: Secondary | ICD-10-CM | POA: Diagnosis not present

## 2015-02-23 DIAGNOSIS — G709 Myoneural disorder, unspecified: Secondary | ICD-10-CM | POA: Diagnosis not present

## 2015-02-23 DIAGNOSIS — M21371 Foot drop, right foot: Secondary | ICD-10-CM | POA: Diagnosis not present

## 2015-02-23 DIAGNOSIS — M4306 Spondylolysis, lumbar region: Secondary | ICD-10-CM | POA: Diagnosis not present

## 2015-02-23 DIAGNOSIS — G8191 Hemiplegia, unspecified affecting right dominant side: Secondary | ICD-10-CM | POA: Diagnosis not present

## 2015-02-24 DIAGNOSIS — M21371 Foot drop, right foot: Secondary | ICD-10-CM | POA: Diagnosis not present

## 2015-02-24 DIAGNOSIS — M4306 Spondylolysis, lumbar region: Secondary | ICD-10-CM | POA: Diagnosis not present

## 2015-02-24 DIAGNOSIS — G709 Myoneural disorder, unspecified: Secondary | ICD-10-CM | POA: Diagnosis not present

## 2015-02-24 DIAGNOSIS — G8191 Hemiplegia, unspecified affecting right dominant side: Secondary | ICD-10-CM | POA: Diagnosis not present

## 2015-02-24 DIAGNOSIS — F329 Major depressive disorder, single episode, unspecified: Secondary | ICD-10-CM | POA: Diagnosis not present

## 2015-02-24 DIAGNOSIS — I10 Essential (primary) hypertension: Secondary | ICD-10-CM | POA: Diagnosis not present

## 2015-03-01 ENCOUNTER — Other Ambulatory Visit: Payer: Self-pay | Admitting: Emergency Medicine

## 2015-03-01 DIAGNOSIS — G8191 Hemiplegia, unspecified affecting right dominant side: Secondary | ICD-10-CM | POA: Diagnosis not present

## 2015-03-01 DIAGNOSIS — M21371 Foot drop, right foot: Secondary | ICD-10-CM | POA: Diagnosis not present

## 2015-03-01 DIAGNOSIS — I10 Essential (primary) hypertension: Secondary | ICD-10-CM | POA: Diagnosis not present

## 2015-03-01 DIAGNOSIS — M4306 Spondylolysis, lumbar region: Secondary | ICD-10-CM | POA: Diagnosis not present

## 2015-03-01 DIAGNOSIS — G709 Myoneural disorder, unspecified: Secondary | ICD-10-CM | POA: Diagnosis not present

## 2015-03-01 DIAGNOSIS — F329 Major depressive disorder, single episode, unspecified: Secondary | ICD-10-CM | POA: Diagnosis not present

## 2015-03-02 NOTE — Telephone Encounter (Signed)
Dr Carlota Raspberry, you discussed depression and this med when pt was here last month, but wrote that pt had psychiatrist, Dr Toy Care. Pharm is requesting a Rx from you though, now. Do you want to write for this or decline and have them send to Dr Toy Care?

## 2015-03-03 NOTE — Telephone Encounter (Signed)
I saw him as a hospital follow up.  He noted his PCP to be Dr. [name] and Dr. Toy Care his psychiatrist.  This is second med refill request I have received, so not sure why these are coming to me. Would send to Dr. Toy Care if he is still following patient, but if not let us know so he does not run out.

## 2015-03-04 NOTE — Telephone Encounter (Signed)
Spoke with patient and informed him 2 month supply already faxed to [facility] for him. He stated after he "runs out of medicine andmoves he will request refill with new address". He also stated he will get established with new provider. He verbalzied understanding for call.

## 2015-03-04 NOTE — Telephone Encounter (Signed)
Pt was contacted by [facility] and was told 2 month supply of Clopidogrel was denied by our office. He still needs the rx and is requesting a rx be faxed to the   [facility] on [address] you

## 2015-03-05 ENCOUNTER — Telehealth: Payer: Self-pay | Admitting: *Deleted

## 2015-03-05 ENCOUNTER — Ambulatory Visit (INDEPENDENT_AMBULATORY_CARE_PROVIDER_SITE_OTHER): Payer: Medicare Other | Admitting: Internal Medicine

## 2015-03-05 ENCOUNTER — Telehealth: Payer: Self-pay | Admitting: Emergency Medicine

## 2015-03-05 VITALS — BP 134/82 | HR 98 | Temp 98.6°F | Resp 18

## 2015-03-05 DIAGNOSIS — R410 Disorientation, unspecified: Secondary | ICD-10-CM

## 2015-03-05 DIAGNOSIS — F329 Major depressive disorder, single episode, unspecified: Secondary | ICD-10-CM | POA: Diagnosis not present

## 2015-03-05 DIAGNOSIS — G8191 Hemiplegia, unspecified affecting right dominant side: Secondary | ICD-10-CM | POA: Diagnosis not present

## 2015-03-05 DIAGNOSIS — R5383 Other fatigue: Secondary | ICD-10-CM | POA: Diagnosis not present

## 2015-03-05 DIAGNOSIS — M4306 Spondylolysis, lumbar region: Secondary | ICD-10-CM | POA: Diagnosis not present

## 2015-03-05 DIAGNOSIS — G709 Myoneural disorder, unspecified: Secondary | ICD-10-CM | POA: Diagnosis not present

## 2015-03-05 DIAGNOSIS — M21371 Foot drop, right foot: Secondary | ICD-10-CM | POA: Diagnosis not present

## 2015-03-05 DIAGNOSIS — I10 Essential (primary) hypertension: Secondary | ICD-10-CM | POA: Diagnosis not present

## 2015-03-05 LAB — POCT CBC
GRANULOCYTE PERCENT: 68.9 % (ref 37–80)
HEMATOCRIT: 40.6 % — AB (ref 43.5–53.7)
Hemoglobin: 14.9 g/dL (ref 14.1–18.1)
Lymph, poc: 1.3 (ref 0.6–3.4)
MCH, POC: 33.8 pg — AB (ref 27–31.2)
MCHC: 36.7 g/dL — AB (ref 31.8–35.4)
MCV: 92.1 fL (ref 80–97)
MID (CBC): 0.7 (ref 0–0.9)
MPV: 7.5 fL (ref 0–99.8)
PLATELET COUNT, POC: 169 10*3/uL (ref 142–424)
POC GRANULOCYTE: 4.5 (ref 2–6.9)
POC LYMPH %: 20.3 % (ref 10–50)
POC MID %: 10.8 %M (ref 0–12)
RBC: 4.41 M/uL — AB (ref 4.69–6.13)
RDW, POC: 13.5 %
WBC: 6.6 10*3/uL (ref 4.6–10.2)

## 2015-03-05 LAB — GLUCOSE, POCT (MANUAL RESULT ENTRY): POC Glucose: 116 mg/dl — AB (ref 70–99)

## 2015-03-05 MED ORDER — DICLOFENAC SODIUM 1 % TD GEL: 2.0000 g | Freq: Every day | TRANSDERMAL | Status: AC | PRN

## 2015-03-05 NOTE — Telephone Encounter (Signed)
Reginald Edwards from [facility] called and stated that after a short walk around the house that the pt felt extreme feeling of exhaustion, bp was 164/100, he was short of breath, respirations 22, heart rate was 90.  It took him about 5 minutes to calm down from the walk.  He is having more pronounced aphasia.  Advised that he needs to be seen.    Dr. Marilynne Drivers

## 2015-03-05 NOTE — Progress Notes (Signed)
Subjective:  By signing my name below, I, Reginald Edwards, attest that this documentation has been prepared under the direction and in the presence of Reginald Koyanagi, MD Electronically Signed: Ladene Edwards, ED Scribe 03/05/2015 at 6:05 PM.   Patient ID: Reginald Edwards, male    DOB: 1945-08-29, 69 y.o.   MRN: [id number]  Chief Complaint  Patient presents with  . Other    vitals signs weren't normal this morning   HPI HPI Comments: Reginald Edwards is a 70 y.o. male, with a h/o TIA, who presents to the [facility] complaining of gradually worsening fatigue over the past 2 weeks. Pt states that he walking at PT earlier today when he noticed increased fatigue, imbalance/dizziness, confusion and SOB. Pt states that he typically takes a nap to refresh himself when he usually experiences SOB. Pt's wife noticed confusion this morning, stating that he had difficulty alphabetizing name tags. Pt denies dizziness or SOB at this time. Wife further reports that pt was dragging his right leg this morning upon ambulating. Pt wears a right leg brace due to foot drop. He denies new infection, cough or chest pain.   Past Medical History  Diagnosis Date  . Hyperlipemia   . Essential hypertension   . Mood disorder (Reginald Edwards)   . Hearing loss   . Arthritis   . Neuromuscular disorder ([place])   . Depression   . Anxiety   . DJD (degenerative joint disease) of lumbar spine   . Cancer ([place])     skin   . TIA (transient ischemic attack)   . Stroke ([place]) 01/21/2015    TIA   Current Outpatient Prescriptions on File Prior to Visit  Medication Sig Dispense Refill  . ALPRAZolam (XANAX) 0.25 MG tablet Take 0.25 mg by mouth 3 (three) times daily.     Marland Kitchen amLODipine (NORVASC) 2.5 MG tablet Take 1 tablet (2.5 mg total) by mouth daily. 90 tablet 1  . amphetamine-dextroamphetamine (ADDERALL) 20 MG tablet Take 20 mg by mouth 3 (three) times daily.      Marland Kitchen atorvastatin (LIPITOR) 20 MG tablet TAKE 1 TABLET BY  MOUTH EVERY DAY (Patient taking differently: Take 20 mg by mouth daily at 6 PM. TAKE 1 TABLET BY MOUTH EVERY DAY) 90 tablet 3  . buPROPion (WELLBUTRIN SR) 200 MG 12 hr tablet Take 200 mg by mouth 3 (three) times daily.    . calcium carbonate (TUMS - DOSED IN MG ELEMENTAL CALCIUM) 500 MG chewable tablet Chew 1 tablet by mouth 3 (three) times daily.    . cholecalciferol (VITAMIN D) 1000 units tablet Take 5,000 Units by mouth daily.    . clopidogrel (PLAVIX) 75 MG tablet Take 1 tablet (75 mg total) by mouth daily. 30 tablet 0  . diclofenac sodium (VOLTAREN) 1 % GEL Apply 2 g topically daily as needed (pain).    Marland Kitchen losartan (COZAAR) 50 MG tablet Take 1 tablet (50 mg total) by mouth daily. 7 tablet 0  . Multiple Vitamin (MULTIVITAMIN) tablet Take 1 tablet by mouth daily.    . tamsulosin (FLOMAX) 0.4 MG CAPS capsule Take 0.4 mg by mouth daily.     . temazepam (RESTORIL) 15 MG capsule Take 15 mg by mouth at bedtime as needed for sleep. X 2    . traZODone (DESYREL) 50 MG tablet Take 50 mg by mouth at bedtime.     No current facility-administered medications on file prior to visit.   Allergies  Allergen Reactions  . Clorazepate  Makes him extremely groggy  . Lisinopril     REACTION: constant cough,irritation  . Vicodin [Hydrocodone-Acetaminophen]     Vomiting, drowsy, hallucinations   Review of Systems  Constitutional: Positive for fatigue.  Respiratory: Positive for shortness of breath. Negative for cough.   Cardiovascular: Negative for chest pain.  Neurological: Positive for dizziness.  Psychiatric/Behavioral: Positive for confusion.      Objective:   Physical Exam  Constitutional: He is oriented to person, place, and time. He appears well-developed and well-nourished. No distress.  HENT:  Head: Normocephalic and atraumatic.  Right Ear: External ear normal.  Left Ear: External ear normal.  Nose: Nose normal.  Mouth/Throat: Oropharynx is clear and moist.  Eyes: Conjunctivae and EOM  are normal. Pupils are equal, round, and reactive to light.  Pupils are small but ERRL and EOM conjugate without nystagmus.   Neck: Normal range of motion. Neck supple. No thyromegaly present.  Cardiovascular: Normal rate, regular rhythm, normal heart sounds and intact distal pulses.   Pulmonary/Chest: Effort normal and breath sounds normal. No respiratory distress.  Abdominal: There is no tenderness.  Musculoskeletal: Normal range of motion. He exhibits no edema.  Mild tremor at rest and with finger to nose, which is otherwise intact. ROM intact. He has a foot drop on the R protected by a split. Wears a back brace because of spinal stenosis.   Lymphadenopathy:    He has no cervical adenopathy.  Neurological: He is alert and oriented to person, place, and time. No cranial nerve deficit. Coordination normal.  UE reflexes are 1+ and symmetrical. LE reflexes are absent.  Skin: Skin is warm and dry.  Psychiatric: He has a normal mood and affect. His behavior is normal. Thought content normal.  Nursing note and vitals reviewed.     Assessment & Plan:  I have completed the patient encounter in its entirety as documented by the scribe, with editing by me where necessary. Reginald Edwards, M.D.   Easy fatigability - Plan: POCT CBC, POCT glucose (manual entry), Comprehensive metabolic panel, TSH  Confusion with nonfocal neurological examination - Plan: POCT glucose (manual entry)  Exam now stable and unrevealing except for chronic issues Will screen for metabolic problems Suspect side effects from confluence of many meds or stress related from planned move

## 2015-03-05 NOTE — Telephone Encounter (Signed)
Dr Everlene Farrier, advise on refill?

## 2015-03-05 NOTE — Telephone Encounter (Signed)
Please call the patient know this prescription was prescribed by his neurologist not by our practice. He should contact them to get approval for refill.

## 2015-03-06 LAB — COMPREHENSIVE METABOLIC PANEL WITH GFR
ALT: 22 U/L (ref 9–46)
AST: 22 U/L (ref 10–35)
Albumin: 4.5 g/dL (ref 3.6–5.1)
Alkaline Phosphatase: 66 U/L (ref 40–115)
BUN: 19 mg/dL (ref 7–25)
CO2: 31 mmol/L (ref 20–31)
Calcium: 9.9 mg/dL (ref 8.6–10.3)
Chloride: 102 mmol/L (ref 98–110)
Creat: 1.19 mg/dL (ref 0.70–1.25)
Glucose, Bld: 114 mg/dL — ABNORMAL HIGH (ref 65–99)
Potassium: 4.2 mmol/L (ref 3.5–5.3)
Sodium: 144 mmol/L (ref 135–146)
Total Bilirubin: 0.4 mg/dL (ref 0.2–1.2)
Total Protein: 6.7 g/dL (ref 6.1–8.1)

## 2015-03-06 LAB — TSH: TSH: 2.74 mIU/L (ref 0.40–4.50)

## 2015-03-11 ENCOUNTER — Telehealth: Payer: Self-pay

## 2015-03-11 NOTE — Telephone Encounter (Signed)
PA for diclofenac 1% gel completed on covermymeds. Pt has renal insufficiency and can not use oral NSAIDS. Pending.

## 2015-03-12 NOTE — Telephone Encounter (Signed)
PA approved through 01/02/16. Notified pharm.  

## 2015-03-22 ENCOUNTER — Telehealth: Payer: Self-pay | Admitting: Diagnostic Neuroimaging

## 2015-03-22 DIAGNOSIS — M5416 Radiculopathy, lumbar region: Secondary | ICD-10-CM

## 2015-03-22 DIAGNOSIS — M48061 Spinal stenosis, lumbar region without neurogenic claudication: Secondary | ICD-10-CM

## 2015-03-22 NOTE — Telephone Encounter (Signed)
Reginald Edwards/[facility] [id number] requests that order for Nursing, PT and speech be changed to start next week, per patient request. Patient transferred from [place] office to [place] out of [place], patient has moved to [place] and is in the middle of unpacking boxes etc.

## 2015-03-22 NOTE — Telephone Encounter (Signed)
Spoke with Lattie Haw and advised she may wait one week before starting patient's nursing, OT, speech therapy. She verbalized understanding, appreciation.

## 2015-03-29 DIAGNOSIS — R471 Dysarthria and anarthria: Secondary | ICD-10-CM | POA: Diagnosis not present

## 2015-03-29 DIAGNOSIS — M21371 Foot drop, right foot: Secondary | ICD-10-CM | POA: Diagnosis not present

## 2015-03-29 DIAGNOSIS — G709 Myoneural disorder, unspecified: Secondary | ICD-10-CM | POA: Diagnosis not present

## 2015-03-29 DIAGNOSIS — I10 Essential (primary) hypertension: Secondary | ICD-10-CM | POA: Diagnosis not present

## 2015-03-29 DIAGNOSIS — F329 Major depressive disorder, single episode, unspecified: Secondary | ICD-10-CM | POA: Diagnosis not present

## 2015-03-29 DIAGNOSIS — G8191 Hemiplegia, unspecified affecting right dominant side: Secondary | ICD-10-CM | POA: Diagnosis not present

## 2015-03-29 NOTE — Telephone Encounter (Signed)
Spoke with Reginald Edwards and requested she fax orders for signature directly to this RN nursing station. Informed her Dr Leta Baptist will sign, orders will be faxed back. She verbalized understanding, appreciation.

## 2015-03-29 NOTE — Telephone Encounter (Signed)
Lattie Haw with [facility] called to clarify that Dr Leta Baptist will sign orders for home health PT, OT and speech, nursing

## 2015-03-30 DIAGNOSIS — M21371 Foot drop, right foot: Secondary | ICD-10-CM | POA: Diagnosis not present

## 2015-03-30 DIAGNOSIS — G8191 Hemiplegia, unspecified affecting right dominant side: Secondary | ICD-10-CM | POA: Diagnosis not present

## 2015-03-30 DIAGNOSIS — R471 Dysarthria and anarthria: Secondary | ICD-10-CM | POA: Diagnosis not present

## 2015-03-30 DIAGNOSIS — G709 Myoneural disorder, unspecified: Secondary | ICD-10-CM | POA: Diagnosis not present

## 2015-03-30 DIAGNOSIS — F329 Major depressive disorder, single episode, unspecified: Secondary | ICD-10-CM | POA: Diagnosis not present

## 2015-03-30 DIAGNOSIS — I10 Essential (primary) hypertension: Secondary | ICD-10-CM | POA: Diagnosis not present

## 2015-03-31 ENCOUNTER — Telehealth: Payer: Self-pay | Admitting: *Deleted

## 2015-03-31 DIAGNOSIS — G8191 Hemiplegia, unspecified affecting right dominant side: Secondary | ICD-10-CM | POA: Diagnosis not present

## 2015-03-31 DIAGNOSIS — I10 Essential (primary) hypertension: Secondary | ICD-10-CM | POA: Diagnosis not present

## 2015-03-31 DIAGNOSIS — G709 Myoneural disorder, unspecified: Secondary | ICD-10-CM | POA: Diagnosis not present

## 2015-03-31 DIAGNOSIS — F329 Major depressive disorder, single episode, unspecified: Secondary | ICD-10-CM | POA: Diagnosis not present

## 2015-03-31 DIAGNOSIS — M21371 Foot drop, right foot: Secondary | ICD-10-CM | POA: Diagnosis not present

## 2015-03-31 DIAGNOSIS — R471 Dysarthria and anarthria: Secondary | ICD-10-CM | POA: Diagnosis not present

## 2015-03-31 NOTE — Addendum Note (Signed)
Addended by: Minna Antis on: 03/31/2015 03:43 PM   Modules accepted: Orders

## 2015-03-31 NOTE — Telephone Encounter (Signed)
Order for R carbon fiber AFO faxed to [facility] [phone] per PT request.

## 2015-03-31 NOTE — Telephone Encounter (Signed)
Michelle/[facility] [phone] called to request verbal order for home care, advised nurse spoke with Lattie Haw 03/29/15. Sharyn Lull also requests order to be sent to [facility]/ [facility] Fax# [phone] for right carbon fiber AFO and to include diagnosis code on prescription.

## 2015-04-02 DIAGNOSIS — I10 Essential (primary) hypertension: Secondary | ICD-10-CM | POA: Diagnosis not present

## 2015-04-02 DIAGNOSIS — M21371 Foot drop, right foot: Secondary | ICD-10-CM | POA: Diagnosis not present

## 2015-04-02 DIAGNOSIS — F329 Major depressive disorder, single episode, unspecified: Secondary | ICD-10-CM | POA: Diagnosis not present

## 2015-04-02 DIAGNOSIS — G709 Myoneural disorder, unspecified: Secondary | ICD-10-CM | POA: Diagnosis not present

## 2015-04-02 DIAGNOSIS — R471 Dysarthria and anarthria: Secondary | ICD-10-CM | POA: Diagnosis not present

## 2015-04-02 DIAGNOSIS — G8191 Hemiplegia, unspecified affecting right dominant side: Secondary | ICD-10-CM | POA: Diagnosis not present

## 2015-04-05 DIAGNOSIS — I10 Essential (primary) hypertension: Secondary | ICD-10-CM | POA: Diagnosis not present

## 2015-04-05 DIAGNOSIS — F329 Major depressive disorder, single episode, unspecified: Secondary | ICD-10-CM | POA: Diagnosis not present

## 2015-04-05 DIAGNOSIS — M21371 Foot drop, right foot: Secondary | ICD-10-CM | POA: Diagnosis not present

## 2015-04-05 DIAGNOSIS — R471 Dysarthria and anarthria: Secondary | ICD-10-CM | POA: Diagnosis not present

## 2015-04-05 DIAGNOSIS — G709 Myoneural disorder, unspecified: Secondary | ICD-10-CM | POA: Diagnosis not present

## 2015-04-05 DIAGNOSIS — G8191 Hemiplegia, unspecified affecting right dominant side: Secondary | ICD-10-CM | POA: Diagnosis not present

## 2015-04-07 DIAGNOSIS — G8191 Hemiplegia, unspecified affecting right dominant side: Secondary | ICD-10-CM | POA: Diagnosis not present

## 2015-04-07 DIAGNOSIS — F329 Major depressive disorder, single episode, unspecified: Secondary | ICD-10-CM | POA: Diagnosis not present

## 2015-04-07 DIAGNOSIS — R471 Dysarthria and anarthria: Secondary | ICD-10-CM | POA: Diagnosis not present

## 2015-04-07 DIAGNOSIS — I10 Essential (primary) hypertension: Secondary | ICD-10-CM | POA: Diagnosis not present

## 2015-04-07 DIAGNOSIS — G709 Myoneural disorder, unspecified: Secondary | ICD-10-CM | POA: Diagnosis not present

## 2015-04-07 DIAGNOSIS — M21371 Foot drop, right foot: Secondary | ICD-10-CM | POA: Diagnosis not present

## 2015-04-08 DIAGNOSIS — G709 Myoneural disorder, unspecified: Secondary | ICD-10-CM | POA: Diagnosis not present

## 2015-04-08 DIAGNOSIS — R471 Dysarthria and anarthria: Secondary | ICD-10-CM | POA: Diagnosis not present

## 2015-04-08 DIAGNOSIS — I10 Essential (primary) hypertension: Secondary | ICD-10-CM | POA: Diagnosis not present

## 2015-04-08 DIAGNOSIS — M21371 Foot drop, right foot: Secondary | ICD-10-CM | POA: Diagnosis not present

## 2015-04-08 DIAGNOSIS — G8191 Hemiplegia, unspecified affecting right dominant side: Secondary | ICD-10-CM | POA: Diagnosis not present

## 2015-04-08 DIAGNOSIS — F329 Major depressive disorder, single episode, unspecified: Secondary | ICD-10-CM | POA: Diagnosis not present

## 2015-04-09 DIAGNOSIS — Z6832 Body mass index (BMI) 32.0-32.9, adult: Secondary | ICD-10-CM | POA: Diagnosis not present

## 2015-04-09 DIAGNOSIS — I10 Essential (primary) hypertension: Secondary | ICD-10-CM | POA: Diagnosis not present

## 2015-04-09 DIAGNOSIS — Z1389 Encounter for screening for other disorder: Secondary | ICD-10-CM | POA: Diagnosis not present

## 2015-04-09 DIAGNOSIS — E782 Mixed hyperlipidemia: Secondary | ICD-10-CM | POA: Diagnosis not present

## 2015-04-09 DIAGNOSIS — F418 Other specified anxiety disorders: Secondary | ICD-10-CM | POA: Diagnosis not present

## 2015-04-09 DIAGNOSIS — G458 Other transient cerebral ischemic attacks and related syndromes: Secondary | ICD-10-CM | POA: Diagnosis not present

## 2015-04-09 DIAGNOSIS — F909 Attention-deficit hyperactivity disorder, unspecified type: Secondary | ICD-10-CM | POA: Diagnosis not present

## 2015-04-09 DIAGNOSIS — F0390 Unspecified dementia without behavioral disturbance: Secondary | ICD-10-CM | POA: Diagnosis not present

## 2015-04-14 DIAGNOSIS — R471 Dysarthria and anarthria: Secondary | ICD-10-CM | POA: Diagnosis not present

## 2015-04-14 DIAGNOSIS — F329 Major depressive disorder, single episode, unspecified: Secondary | ICD-10-CM | POA: Diagnosis not present

## 2015-04-14 DIAGNOSIS — G8191 Hemiplegia, unspecified affecting right dominant side: Secondary | ICD-10-CM | POA: Diagnosis not present

## 2015-04-14 DIAGNOSIS — I10 Essential (primary) hypertension: Secondary | ICD-10-CM | POA: Diagnosis not present

## 2015-04-14 DIAGNOSIS — M21371 Foot drop, right foot: Secondary | ICD-10-CM | POA: Diagnosis not present

## 2015-04-14 DIAGNOSIS — G709 Myoneural disorder, unspecified: Secondary | ICD-10-CM | POA: Diagnosis not present

## 2015-04-15 DIAGNOSIS — M21371 Foot drop, right foot: Secondary | ICD-10-CM | POA: Diagnosis not present

## 2015-04-15 DIAGNOSIS — F329 Major depressive disorder, single episode, unspecified: Secondary | ICD-10-CM | POA: Diagnosis not present

## 2015-04-15 DIAGNOSIS — G709 Myoneural disorder, unspecified: Secondary | ICD-10-CM | POA: Diagnosis not present

## 2015-04-15 DIAGNOSIS — R471 Dysarthria and anarthria: Secondary | ICD-10-CM | POA: Diagnosis not present

## 2015-04-15 DIAGNOSIS — I10 Essential (primary) hypertension: Secondary | ICD-10-CM | POA: Diagnosis not present

## 2015-04-15 DIAGNOSIS — G8191 Hemiplegia, unspecified affecting right dominant side: Secondary | ICD-10-CM | POA: Diagnosis not present

## 2015-04-27 ENCOUNTER — Telehealth: Payer: Self-pay | Admitting: Diagnostic Neuroimaging

## 2015-04-27 NOTE — Telephone Encounter (Signed)
Amy/[facility] [phone] called sts orders for PT have expired. She is requesting orders for PT 1x wk for 2 wks.

## 2015-04-27 NOTE — Telephone Encounter (Signed)
Called Reginald Edwards who stated she actually is requesting for two more nursing visits. Informed her that she may order, per Dr Leta Baptist. She stated she would FU after visits are completed. She verbalized understanding, appreciation.

## 2015-04-30 DIAGNOSIS — M21371 Foot drop, right foot: Secondary | ICD-10-CM | POA: Diagnosis not present

## 2015-04-30 DIAGNOSIS — I10 Essential (primary) hypertension: Secondary | ICD-10-CM | POA: Diagnosis not present

## 2015-04-30 DIAGNOSIS — R471 Dysarthria and anarthria: Secondary | ICD-10-CM | POA: Diagnosis not present

## 2015-04-30 DIAGNOSIS — G8191 Hemiplegia, unspecified affecting right dominant side: Secondary | ICD-10-CM | POA: Diagnosis not present

## 2015-04-30 DIAGNOSIS — F329 Major depressive disorder, single episode, unspecified: Secondary | ICD-10-CM | POA: Diagnosis not present

## 2015-04-30 DIAGNOSIS — G709 Myoneural disorder, unspecified: Secondary | ICD-10-CM | POA: Diagnosis not present

## 2015-05-04 DIAGNOSIS — M21371 Foot drop, right foot: Secondary | ICD-10-CM | POA: Diagnosis not present

## 2015-05-04 DIAGNOSIS — G709 Myoneural disorder, unspecified: Secondary | ICD-10-CM | POA: Diagnosis not present

## 2015-05-04 DIAGNOSIS — G8191 Hemiplegia, unspecified affecting right dominant side: Secondary | ICD-10-CM | POA: Diagnosis not present

## 2015-05-04 DIAGNOSIS — R471 Dysarthria and anarthria: Secondary | ICD-10-CM | POA: Diagnosis not present

## 2015-05-04 DIAGNOSIS — I10 Essential (primary) hypertension: Secondary | ICD-10-CM | POA: Diagnosis not present

## 2015-05-04 DIAGNOSIS — F329 Major depressive disorder, single episode, unspecified: Secondary | ICD-10-CM | POA: Diagnosis not present

## 2015-05-05 ENCOUNTER — Telehealth: Payer: Self-pay | Admitting: Diagnostic Neuroimaging

## 2015-05-05 DIAGNOSIS — M21371 Foot drop, right foot: Secondary | ICD-10-CM | POA: Diagnosis not present

## 2015-05-05 DIAGNOSIS — R269 Unspecified abnormalities of gait and mobility: Secondary | ICD-10-CM | POA: Diagnosis not present

## 2015-05-05 DIAGNOSIS — F068 Other specified mental disorders due to known physiological condition: Secondary | ICD-10-CM | POA: Diagnosis not present

## 2015-05-05 NOTE — Telephone Encounter (Signed)
[facility] called to get medical records on pt. Said they faxed over release at 1:07 and called at 1:32p to check on status. Pt is at the office.  First time asking for status. May call [phone] , Maurie Boettcher.

## 2015-05-05 NOTE — Telephone Encounter (Signed)
Michelle/[facility] [phone] called requesting to extend PT for 1x 1week, 2 x 2 week, then 1 x 1 week. Please call

## 2015-05-05 NOTE — Telephone Encounter (Signed)
Medical records faxed to [facility] on 05/05/15.

## 2015-05-06 DIAGNOSIS — R471 Dysarthria and anarthria: Secondary | ICD-10-CM | POA: Diagnosis not present

## 2015-05-06 DIAGNOSIS — F329 Major depressive disorder, single episode, unspecified: Secondary | ICD-10-CM | POA: Diagnosis not present

## 2015-05-06 DIAGNOSIS — G709 Myoneural disorder, unspecified: Secondary | ICD-10-CM | POA: Diagnosis not present

## 2015-05-06 DIAGNOSIS — G8191 Hemiplegia, unspecified affecting right dominant side: Secondary | ICD-10-CM | POA: Diagnosis not present

## 2015-05-06 DIAGNOSIS — M21371 Foot drop, right foot: Secondary | ICD-10-CM | POA: Diagnosis not present

## 2015-05-06 DIAGNOSIS — I10 Essential (primary) hypertension: Secondary | ICD-10-CM | POA: Diagnosis not present

## 2015-05-06 NOTE — Telephone Encounter (Signed)
LVM informing Sharyn Lull, per Dr Leta Baptist, she may extend PT for 1x 1week, 2 x 2 week, then 1 x 1 week as she recommended. Left name, number for any questions.

## 2015-05-07 DIAGNOSIS — M21371 Foot drop, right foot: Secondary | ICD-10-CM | POA: Diagnosis not present

## 2015-05-07 DIAGNOSIS — G8191 Hemiplegia, unspecified affecting right dominant side: Secondary | ICD-10-CM | POA: Diagnosis not present

## 2015-05-07 DIAGNOSIS — G709 Myoneural disorder, unspecified: Secondary | ICD-10-CM | POA: Diagnosis not present

## 2015-05-07 DIAGNOSIS — I10 Essential (primary) hypertension: Secondary | ICD-10-CM | POA: Diagnosis not present

## 2015-05-07 DIAGNOSIS — R471 Dysarthria and anarthria: Secondary | ICD-10-CM | POA: Diagnosis not present

## 2015-05-07 DIAGNOSIS — F329 Major depressive disorder, single episode, unspecified: Secondary | ICD-10-CM | POA: Diagnosis not present

## 2015-05-10 DIAGNOSIS — M21371 Foot drop, right foot: Secondary | ICD-10-CM | POA: Diagnosis not present

## 2015-05-10 DIAGNOSIS — I10 Essential (primary) hypertension: Secondary | ICD-10-CM | POA: Diagnosis not present

## 2015-05-10 DIAGNOSIS — F329 Major depressive disorder, single episode, unspecified: Secondary | ICD-10-CM | POA: Diagnosis not present

## 2015-05-10 DIAGNOSIS — G8191 Hemiplegia, unspecified affecting right dominant side: Secondary | ICD-10-CM | POA: Diagnosis not present

## 2015-05-10 DIAGNOSIS — R471 Dysarthria and anarthria: Secondary | ICD-10-CM | POA: Diagnosis not present

## 2015-05-10 DIAGNOSIS — G709 Myoneural disorder, unspecified: Secondary | ICD-10-CM | POA: Diagnosis not present

## 2015-05-12 DIAGNOSIS — F329 Major depressive disorder, single episode, unspecified: Secondary | ICD-10-CM | POA: Diagnosis not present

## 2015-05-12 DIAGNOSIS — G709 Myoneural disorder, unspecified: Secondary | ICD-10-CM | POA: Diagnosis not present

## 2015-05-12 DIAGNOSIS — G8191 Hemiplegia, unspecified affecting right dominant side: Secondary | ICD-10-CM | POA: Diagnosis not present

## 2015-05-12 DIAGNOSIS — M21371 Foot drop, right foot: Secondary | ICD-10-CM | POA: Diagnosis not present

## 2015-05-12 DIAGNOSIS — I10 Essential (primary) hypertension: Secondary | ICD-10-CM | POA: Diagnosis not present

## 2015-05-12 DIAGNOSIS — R471 Dysarthria and anarthria: Secondary | ICD-10-CM | POA: Diagnosis not present

## 2015-05-14 DIAGNOSIS — F329 Major depressive disorder, single episode, unspecified: Secondary | ICD-10-CM | POA: Diagnosis not present

## 2015-05-14 DIAGNOSIS — M21371 Foot drop, right foot: Secondary | ICD-10-CM | POA: Diagnosis not present

## 2015-05-14 DIAGNOSIS — G8191 Hemiplegia, unspecified affecting right dominant side: Secondary | ICD-10-CM | POA: Diagnosis not present

## 2015-05-14 DIAGNOSIS — G709 Myoneural disorder, unspecified: Secondary | ICD-10-CM | POA: Diagnosis not present

## 2015-05-14 DIAGNOSIS — R471 Dysarthria and anarthria: Secondary | ICD-10-CM | POA: Diagnosis not present

## 2015-05-14 DIAGNOSIS — I10 Essential (primary) hypertension: Secondary | ICD-10-CM | POA: Diagnosis not present

## 2015-05-16 IMAGING — CR DG ELBOW COMPLETE 3+V*R*
2 series · 2 of 2 positions shown · non-contrast
Comparison: None.

CLINICAL DATA: Right elbow pain after fall.

EXAM:
RIGHT ELBOW - COMPLETE 3+ VIEW

[AP]
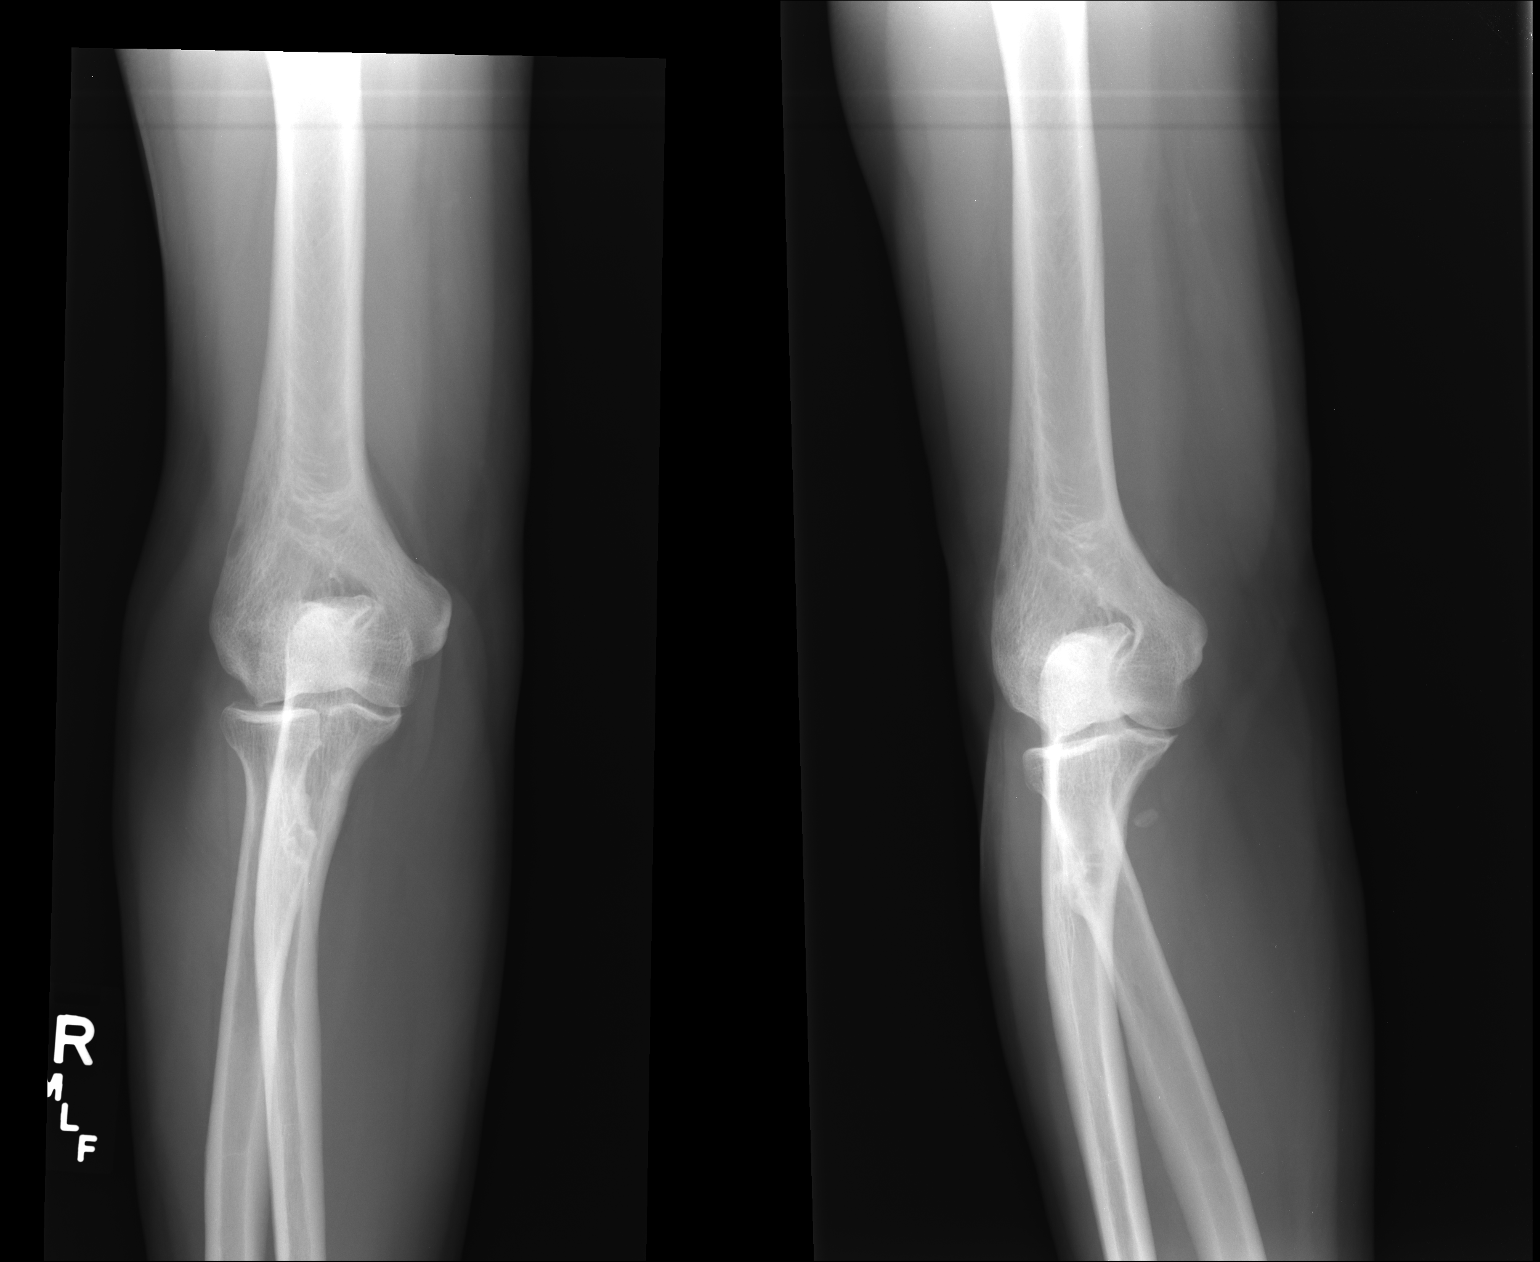

[lateral]
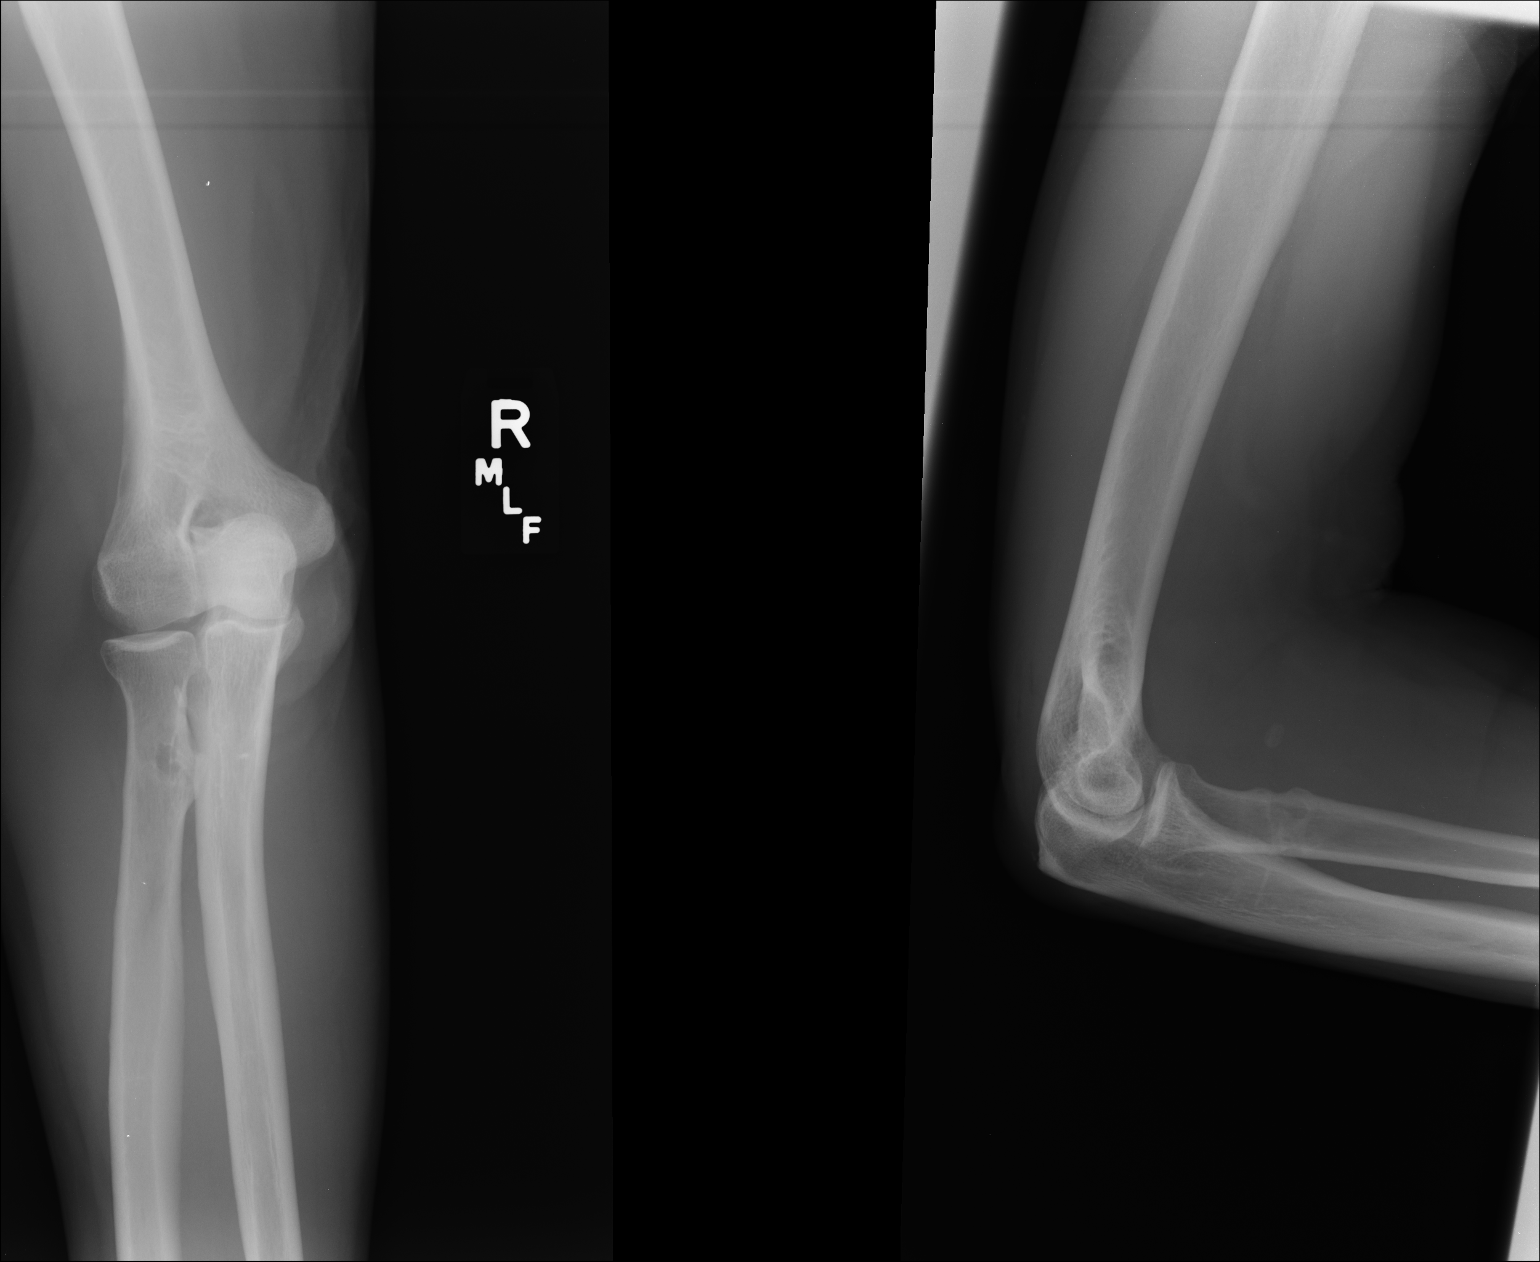

[2 of 2 positions shown; findings below may reference images not displayed]

FINDINGS: There is no evidence of fracture, dislocation, or joint effusion.
Joint spaces are intact. Subchondral cyst formation is noted at
radial tuberosity. Soft tissues are unremarkable.
IMPRESSION: No acute abnormality seen in the right elbow.

## 2015-05-17 DIAGNOSIS — F0390 Unspecified dementia without behavioral disturbance: Secondary | ICD-10-CM | POA: Diagnosis not present

## 2015-05-17 DIAGNOSIS — F332 Major depressive disorder, recurrent severe without psychotic features: Secondary | ICD-10-CM | POA: Diagnosis not present

## 2015-05-19 DIAGNOSIS — R471 Dysarthria and anarthria: Secondary | ICD-10-CM | POA: Diagnosis not present

## 2015-05-19 DIAGNOSIS — G709 Myoneural disorder, unspecified: Secondary | ICD-10-CM | POA: Diagnosis not present

## 2015-05-19 DIAGNOSIS — I10 Essential (primary) hypertension: Secondary | ICD-10-CM | POA: Diagnosis not present

## 2015-05-19 DIAGNOSIS — F329 Major depressive disorder, single episode, unspecified: Secondary | ICD-10-CM | POA: Diagnosis not present

## 2015-05-19 DIAGNOSIS — M21371 Foot drop, right foot: Secondary | ICD-10-CM | POA: Diagnosis not present

## 2015-05-19 DIAGNOSIS — G8191 Hemiplegia, unspecified affecting right dominant side: Secondary | ICD-10-CM | POA: Diagnosis not present

## 2015-05-21 DIAGNOSIS — M21371 Foot drop, right foot: Secondary | ICD-10-CM | POA: Diagnosis not present

## 2015-05-21 DIAGNOSIS — G709 Myoneural disorder, unspecified: Secondary | ICD-10-CM | POA: Diagnosis not present

## 2015-05-21 DIAGNOSIS — R471 Dysarthria and anarthria: Secondary | ICD-10-CM | POA: Diagnosis not present

## 2015-05-21 DIAGNOSIS — F329 Major depressive disorder, single episode, unspecified: Secondary | ICD-10-CM | POA: Diagnosis not present

## 2015-05-21 DIAGNOSIS — I10 Essential (primary) hypertension: Secondary | ICD-10-CM | POA: Diagnosis not present

## 2015-05-21 DIAGNOSIS — G8191 Hemiplegia, unspecified affecting right dominant side: Secondary | ICD-10-CM | POA: Diagnosis not present

## 2015-05-25 DIAGNOSIS — R471 Dysarthria and anarthria: Secondary | ICD-10-CM | POA: Diagnosis not present

## 2015-05-25 DIAGNOSIS — F329 Major depressive disorder, single episode, unspecified: Secondary | ICD-10-CM | POA: Diagnosis not present

## 2015-05-25 DIAGNOSIS — M21371 Foot drop, right foot: Secondary | ICD-10-CM | POA: Diagnosis not present

## 2015-05-25 DIAGNOSIS — I10 Essential (primary) hypertension: Secondary | ICD-10-CM | POA: Diagnosis not present

## 2015-05-25 DIAGNOSIS — G709 Myoneural disorder, unspecified: Secondary | ICD-10-CM | POA: Diagnosis not present

## 2015-05-25 DIAGNOSIS — G8191 Hemiplegia, unspecified affecting right dominant side: Secondary | ICD-10-CM | POA: Diagnosis not present

## 2015-05-26 DIAGNOSIS — I10 Essential (primary) hypertension: Secondary | ICD-10-CM | POA: Diagnosis not present

## 2015-05-26 DIAGNOSIS — R471 Dysarthria and anarthria: Secondary | ICD-10-CM | POA: Diagnosis not present

## 2015-05-26 DIAGNOSIS — G8191 Hemiplegia, unspecified affecting right dominant side: Secondary | ICD-10-CM | POA: Diagnosis not present

## 2015-05-26 DIAGNOSIS — M21371 Foot drop, right foot: Secondary | ICD-10-CM | POA: Diagnosis not present

## 2015-05-26 DIAGNOSIS — G709 Myoneural disorder, unspecified: Secondary | ICD-10-CM | POA: Diagnosis not present

## 2015-05-26 DIAGNOSIS — F329 Major depressive disorder, single episode, unspecified: Secondary | ICD-10-CM | POA: Diagnosis not present

## 2015-05-27 ENCOUNTER — Telehealth: Payer: Self-pay | Admitting: Diagnostic Neuroimaging

## 2015-05-27 NOTE — Telephone Encounter (Signed)
LVM informing Sharyn Lull that that Dr Leta Baptist gives verbal order for patient's new PCP to give new orders to [facility] to continue with his [facility] care. Left name and number for further questions.

## 2015-05-27 NOTE — Telephone Encounter (Signed)
Michelle/[facility] [phone] called to advise, patient has moved out of area and has established with new PCP. Sharyn Lull would like verbal order to see if ok to take new orders from new PCP.

## 2015-05-28 DIAGNOSIS — R296 Repeated falls: Secondary | ICD-10-CM | POA: Diagnosis not present

## 2015-05-28 DIAGNOSIS — M21371 Foot drop, right foot: Secondary | ICD-10-CM | POA: Diagnosis not present

## 2015-05-28 DIAGNOSIS — G8191 Hemiplegia, unspecified affecting right dominant side: Secondary | ICD-10-CM | POA: Diagnosis not present

## 2015-05-28 DIAGNOSIS — I10 Essential (primary) hypertension: Secondary | ICD-10-CM | POA: Diagnosis not present

## 2015-05-28 DIAGNOSIS — R471 Dysarthria and anarthria: Secondary | ICD-10-CM | POA: Diagnosis not present

## 2015-05-28 DIAGNOSIS — G709 Myoneural disorder, unspecified: Secondary | ICD-10-CM | POA: Diagnosis not present

## 2015-06-02 DIAGNOSIS — I10 Essential (primary) hypertension: Secondary | ICD-10-CM | POA: Diagnosis not present

## 2015-06-02 DIAGNOSIS — R471 Dysarthria and anarthria: Secondary | ICD-10-CM | POA: Diagnosis not present

## 2015-06-02 DIAGNOSIS — R296 Repeated falls: Secondary | ICD-10-CM | POA: Diagnosis not present

## 2015-06-02 DIAGNOSIS — M21371 Foot drop, right foot: Secondary | ICD-10-CM | POA: Diagnosis not present

## 2015-06-02 DIAGNOSIS — G8191 Hemiplegia, unspecified affecting right dominant side: Secondary | ICD-10-CM | POA: Diagnosis not present

## 2015-06-02 DIAGNOSIS — G709 Myoneural disorder, unspecified: Secondary | ICD-10-CM | POA: Diagnosis not present

## 2015-06-04 DIAGNOSIS — I10 Essential (primary) hypertension: Secondary | ICD-10-CM | POA: Diagnosis not present

## 2015-06-04 DIAGNOSIS — R296 Repeated falls: Secondary | ICD-10-CM | POA: Diagnosis not present

## 2015-06-04 DIAGNOSIS — G709 Myoneural disorder, unspecified: Secondary | ICD-10-CM | POA: Diagnosis not present

## 2015-06-04 DIAGNOSIS — M21371 Foot drop, right foot: Secondary | ICD-10-CM | POA: Diagnosis not present

## 2015-06-04 DIAGNOSIS — G8191 Hemiplegia, unspecified affecting right dominant side: Secondary | ICD-10-CM | POA: Diagnosis not present

## 2015-06-04 DIAGNOSIS — R471 Dysarthria and anarthria: Secondary | ICD-10-CM | POA: Diagnosis not present

## 2015-06-07 DIAGNOSIS — M21371 Foot drop, right foot: Secondary | ICD-10-CM | POA: Diagnosis not present

## 2015-06-07 DIAGNOSIS — I10 Essential (primary) hypertension: Secondary | ICD-10-CM | POA: Diagnosis not present

## 2015-06-07 DIAGNOSIS — G8191 Hemiplegia, unspecified affecting right dominant side: Secondary | ICD-10-CM | POA: Diagnosis not present

## 2015-06-07 DIAGNOSIS — R296 Repeated falls: Secondary | ICD-10-CM | POA: Diagnosis not present

## 2015-06-07 DIAGNOSIS — G709 Myoneural disorder, unspecified: Secondary | ICD-10-CM | POA: Diagnosis not present

## 2015-06-07 DIAGNOSIS — R471 Dysarthria and anarthria: Secondary | ICD-10-CM | POA: Diagnosis not present

## 2015-06-08 DIAGNOSIS — Z08 Encounter for follow-up examination after completed treatment for malignant neoplasm: Secondary | ICD-10-CM | POA: Diagnosis not present

## 2015-06-08 DIAGNOSIS — Z85820 Personal history of malignant melanoma of skin: Secondary | ICD-10-CM | POA: Diagnosis not present

## 2015-06-11 ENCOUNTER — Other Ambulatory Visit: Payer: Self-pay | Admitting: Diagnostic Neuroimaging

## 2015-06-11 DIAGNOSIS — R296 Repeated falls: Secondary | ICD-10-CM | POA: Diagnosis not present

## 2015-06-11 DIAGNOSIS — I10 Essential (primary) hypertension: Secondary | ICD-10-CM | POA: Diagnosis not present

## 2015-06-11 DIAGNOSIS — M21371 Foot drop, right foot: Secondary | ICD-10-CM | POA: Diagnosis not present

## 2015-06-11 DIAGNOSIS — G8191 Hemiplegia, unspecified affecting right dominant side: Secondary | ICD-10-CM | POA: Diagnosis not present

## 2015-06-11 DIAGNOSIS — G709 Myoneural disorder, unspecified: Secondary | ICD-10-CM | POA: Diagnosis not present

## 2015-06-11 DIAGNOSIS — R471 Dysarthria and anarthria: Secondary | ICD-10-CM | POA: Diagnosis not present

## 2015-06-14 DIAGNOSIS — R471 Dysarthria and anarthria: Secondary | ICD-10-CM | POA: Diagnosis not present

## 2015-06-14 DIAGNOSIS — R296 Repeated falls: Secondary | ICD-10-CM | POA: Diagnosis not present

## 2015-06-14 DIAGNOSIS — G709 Myoneural disorder, unspecified: Secondary | ICD-10-CM | POA: Diagnosis not present

## 2015-06-14 DIAGNOSIS — G8191 Hemiplegia, unspecified affecting right dominant side: Secondary | ICD-10-CM | POA: Diagnosis not present

## 2015-06-14 DIAGNOSIS — I10 Essential (primary) hypertension: Secondary | ICD-10-CM | POA: Diagnosis not present

## 2015-06-14 DIAGNOSIS — M21371 Foot drop, right foot: Secondary | ICD-10-CM | POA: Diagnosis not present

## 2015-06-16 DIAGNOSIS — R296 Repeated falls: Secondary | ICD-10-CM | POA: Diagnosis not present

## 2015-06-16 DIAGNOSIS — R471 Dysarthria and anarthria: Secondary | ICD-10-CM | POA: Diagnosis not present

## 2015-06-16 DIAGNOSIS — M21371 Foot drop, right foot: Secondary | ICD-10-CM | POA: Diagnosis not present

## 2015-06-16 DIAGNOSIS — G8191 Hemiplegia, unspecified affecting right dominant side: Secondary | ICD-10-CM | POA: Diagnosis not present

## 2015-06-16 DIAGNOSIS — G709 Myoneural disorder, unspecified: Secondary | ICD-10-CM | POA: Diagnosis not present

## 2015-06-16 DIAGNOSIS — I10 Essential (primary) hypertension: Secondary | ICD-10-CM | POA: Diagnosis not present

## 2015-06-17 DIAGNOSIS — F0390 Unspecified dementia without behavioral disturbance: Secondary | ICD-10-CM | POA: Diagnosis not present

## 2015-06-17 DIAGNOSIS — F332 Major depressive disorder, recurrent severe without psychotic features: Secondary | ICD-10-CM | POA: Diagnosis not present

## 2015-06-17 DIAGNOSIS — M21371 Foot drop, right foot: Secondary | ICD-10-CM | POA: Diagnosis not present

## 2015-06-17 DIAGNOSIS — R471 Dysarthria and anarthria: Secondary | ICD-10-CM | POA: Diagnosis not present

## 2015-06-17 DIAGNOSIS — I10 Essential (primary) hypertension: Secondary | ICD-10-CM | POA: Diagnosis not present

## 2015-06-17 DIAGNOSIS — R296 Repeated falls: Secondary | ICD-10-CM | POA: Diagnosis not present

## 2015-06-17 DIAGNOSIS — G709 Myoneural disorder, unspecified: Secondary | ICD-10-CM | POA: Diagnosis not present

## 2015-06-17 DIAGNOSIS — G8191 Hemiplegia, unspecified affecting right dominant side: Secondary | ICD-10-CM | POA: Diagnosis not present

## 2015-06-18 DIAGNOSIS — M21371 Foot drop, right foot: Secondary | ICD-10-CM | POA: Diagnosis not present

## 2015-06-18 DIAGNOSIS — G8191 Hemiplegia, unspecified affecting right dominant side: Secondary | ICD-10-CM | POA: Diagnosis not present

## 2015-06-18 DIAGNOSIS — G709 Myoneural disorder, unspecified: Secondary | ICD-10-CM | POA: Diagnosis not present

## 2015-06-18 DIAGNOSIS — R296 Repeated falls: Secondary | ICD-10-CM | POA: Diagnosis not present

## 2015-06-18 DIAGNOSIS — R471 Dysarthria and anarthria: Secondary | ICD-10-CM | POA: Diagnosis not present

## 2015-06-18 DIAGNOSIS — I10 Essential (primary) hypertension: Secondary | ICD-10-CM | POA: Diagnosis not present

## 2015-06-21 DIAGNOSIS — I10 Essential (primary) hypertension: Secondary | ICD-10-CM | POA: Diagnosis not present

## 2015-06-21 DIAGNOSIS — E782 Mixed hyperlipidemia: Secondary | ICD-10-CM | POA: Diagnosis not present

## 2015-06-21 DIAGNOSIS — F0390 Unspecified dementia without behavioral disturbance: Secondary | ICD-10-CM | POA: Diagnosis not present

## 2015-06-30 ENCOUNTER — Other Ambulatory Visit: Payer: Self-pay | Admitting: Diagnostic Neuroimaging

## 2015-06-30 ENCOUNTER — Other Ambulatory Visit: Payer: Self-pay | Admitting: Family Medicine

## 2015-07-01 DIAGNOSIS — E782 Mixed hyperlipidemia: Secondary | ICD-10-CM | POA: Diagnosis not present

## 2015-07-01 DIAGNOSIS — F0390 Unspecified dementia without behavioral disturbance: Secondary | ICD-10-CM | POA: Diagnosis not present

## 2015-07-01 DIAGNOSIS — I10 Essential (primary) hypertension: Secondary | ICD-10-CM | POA: Diagnosis not present

## 2015-07-01 DIAGNOSIS — F418 Other specified anxiety disorders: Secondary | ICD-10-CM | POA: Diagnosis not present

## 2015-07-01 DIAGNOSIS — F909 Attention-deficit hyperactivity disorder, unspecified type: Secondary | ICD-10-CM | POA: Diagnosis not present

## 2015-07-19 ENCOUNTER — Telehealth: Payer: Self-pay | Admitting: *Deleted

## 2015-07-19 NOTE — Telephone Encounter (Signed)
Spoke with Delsa Bern, w/[facility] re: fax received requesting Dr Leta Baptist sign May 2017 order to continue with speech therapy. Informed Tim that per Epic notes this RN or Dr Leta Baptist did not speak with anyone regarding verbal or written order for patient to continue with ST in May. Informed him patient last seen by Dr Leta Baptist in Jan 2017, and he moved in approximately mid March 2017 per phone conversation with patient on 02/22/15.  Advised he may need to contact patient's new PCP. Tim verbalized understanding, stated he would investigate further to determine who order needs to go to.

## 2015-07-21 NOTE — Telephone Encounter (Signed)
Aileen with [facility] is calling back to discuss orders as mentioned in below note. She would like to discuss further.

## 2015-07-21 NOTE — Telephone Encounter (Signed)
Received fax this morning from [facility] on orders for patient  from 03/22/15, 04/27/15, 05/10/15.  Spoke with Luanna Cole who stated Dr Leta Baptist had signed other orders for SN, PT, OT, ST during this time frame except they never received signed orders for these 3 remaining. She stated that their second round of therapy orders came from his new PCP in the town he moved in March.  Informed her that Dr Leta Baptist will return to office next Thurs, can sign orders, and this RN will fax back.  She verbalized understanding, stated that was fine.

## 2015-07-22 DIAGNOSIS — T148 Other injury of unspecified body region: Secondary | ICD-10-CM | POA: Diagnosis not present

## 2015-07-23 DIAGNOSIS — N3941 Urge incontinence: Secondary | ICD-10-CM | POA: Diagnosis not present

## 2015-07-28 ENCOUNTER — Ambulatory Visit: Payer: Medicare Other | Admitting: Diagnostic Neuroimaging

## 2015-07-30 DIAGNOSIS — F0390 Unspecified dementia without behavioral disturbance: Secondary | ICD-10-CM | POA: Diagnosis not present

## 2015-07-30 DIAGNOSIS — F332 Major depressive disorder, recurrent severe without psychotic features: Secondary | ICD-10-CM | POA: Diagnosis not present

## 2015-08-25 DIAGNOSIS — R634 Abnormal weight loss: Secondary | ICD-10-CM | POA: Diagnosis not present

## 2015-08-25 DIAGNOSIS — R5382 Chronic fatigue, unspecified: Secondary | ICD-10-CM | POA: Diagnosis not present

## 2015-08-25 DIAGNOSIS — Z6827 Body mass index (BMI) 27.0-27.9, adult: Secondary | ICD-10-CM | POA: Diagnosis not present

## 2015-10-01 DIAGNOSIS — F0390 Unspecified dementia without behavioral disturbance: Secondary | ICD-10-CM | POA: Diagnosis not present

## 2015-10-01 DIAGNOSIS — F332 Major depressive disorder, recurrent severe without psychotic features: Secondary | ICD-10-CM | POA: Diagnosis not present

## 2015-10-14 ENCOUNTER — Other Ambulatory Visit: Payer: Self-pay | Admitting: Family Medicine

## 2015-12-07 ENCOUNTER — Other Ambulatory Visit: Payer: Self-pay

## 2015-12-07 MED ORDER — ATORVASTATIN CALCIUM 20 MG PO TABS: ORAL_TABLET | ORAL | 0 refills | Status: AC

## 2016-01-05 ENCOUNTER — Other Ambulatory Visit: Payer: Self-pay | Admitting: Family Medicine

## 2016-11-18 IMAGING — CT CT HEAD W/O CM
2 series · 15 of 30 positions shown, 19 images · non-contrast
Comparison: MRI brain 01/08/2013

CLINICAL DATA: Aphasia.  Code stroke.

EXAM:
CT HEAD WITHOUT CONTRAST
TECHNIQUE: Contiguous axial images were obtained from the base of the skull
through the vertex without intravenous contrast.

[Series 301: head w/o, idose (1) · axial · non-contrast · 0.49mm/px · z∈[+112,+242]mm · 13 of 32 slices shown, 17 images]
[im 3/32  brain]
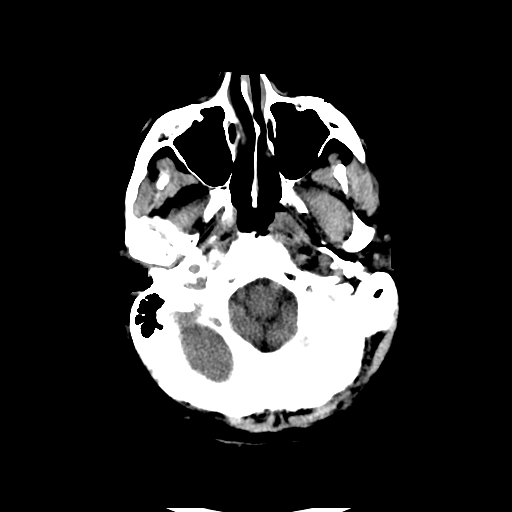
[im 3/32  bone]
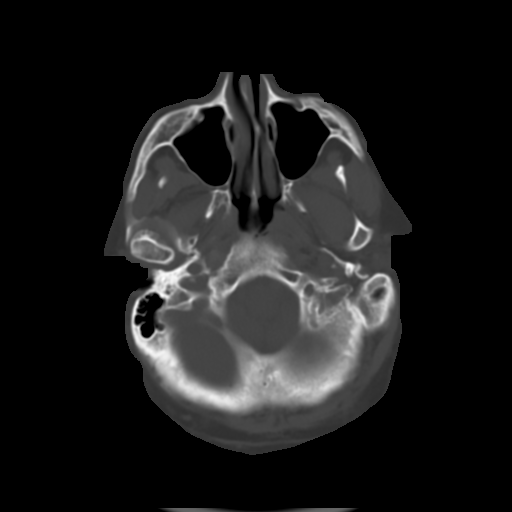
[im 5/32  brain]
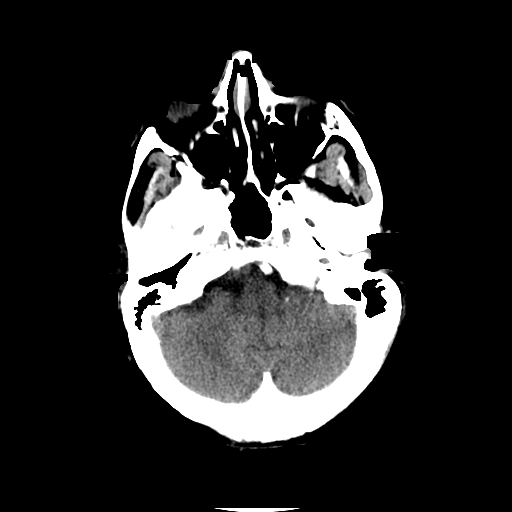
[im 7/32  brain]
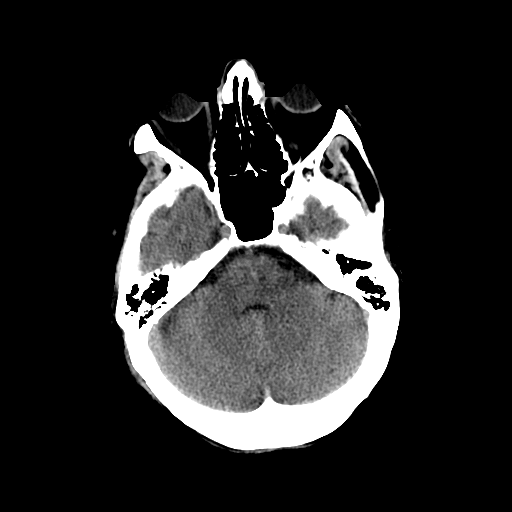
[im 9/32  brain]
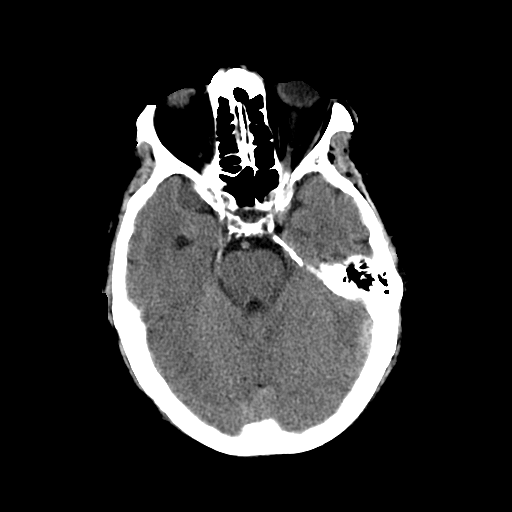
[im 12/32  brain]
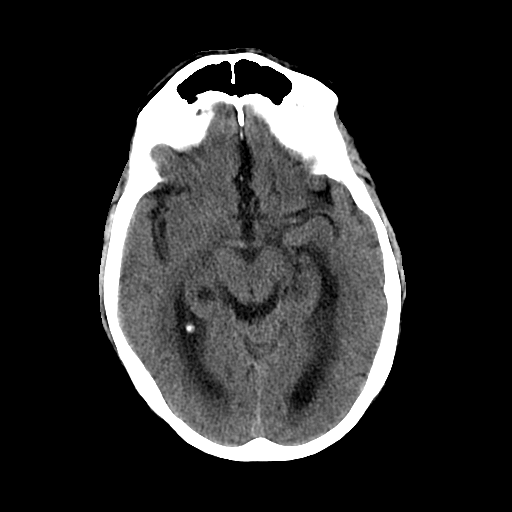
[im 12/32  bone]
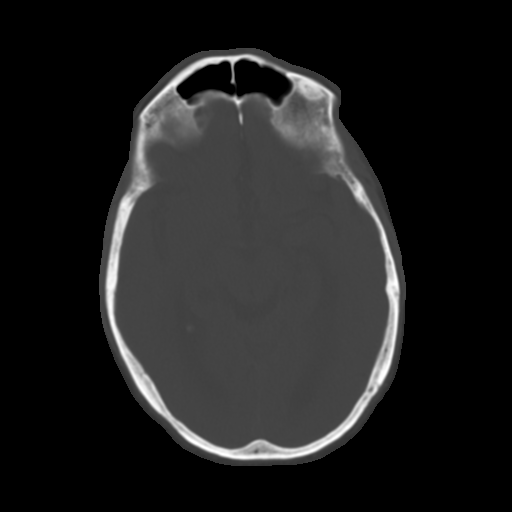
[im 14/32  brain]
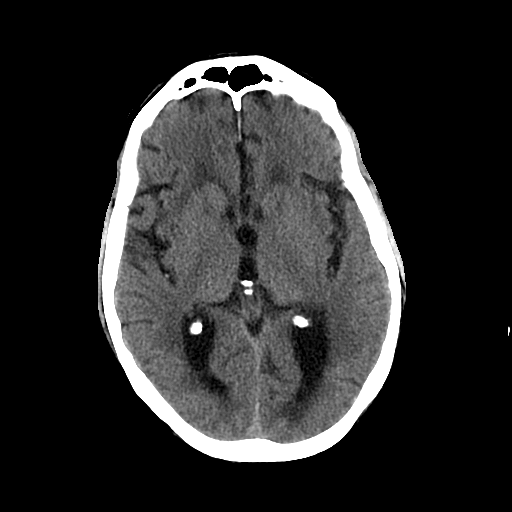
[im 16/32  brain]
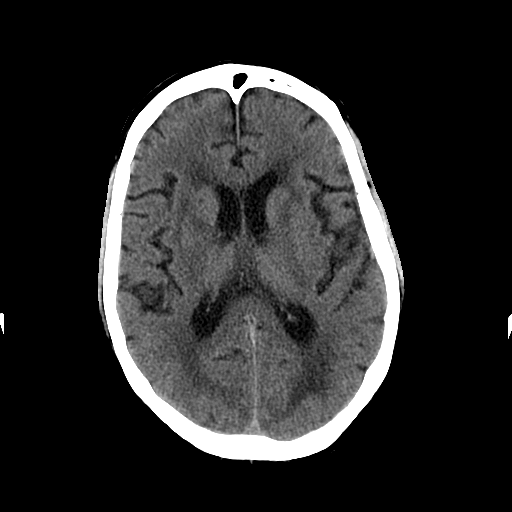
[im 18/32  brain]
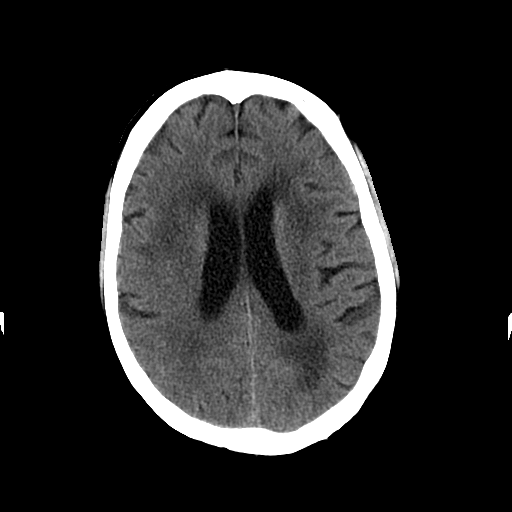
[im 20/32  brain]
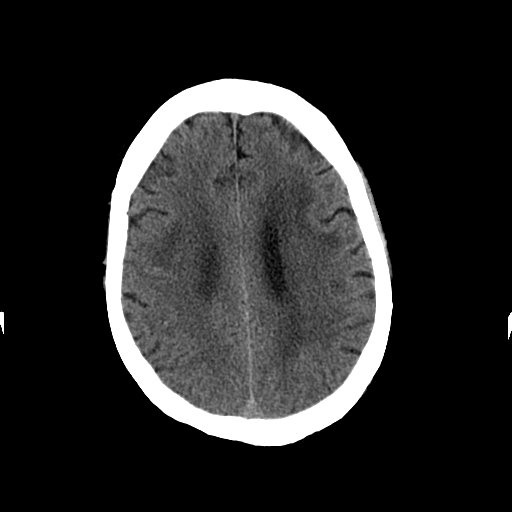
[im 20/32  bone]
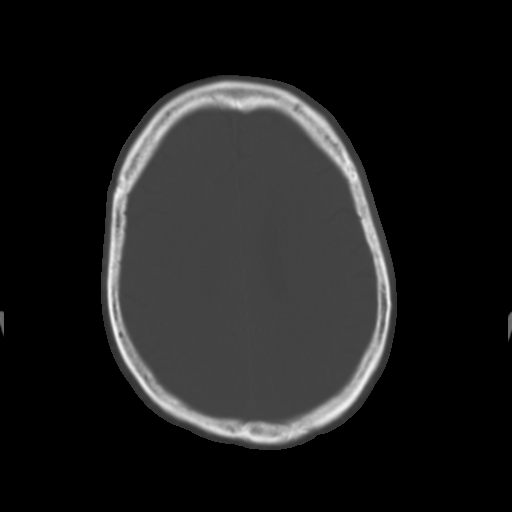
[im 23/32  brain]
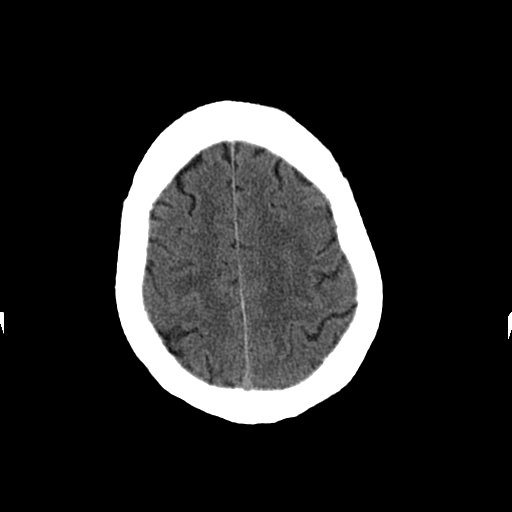
[im 25/32  brain]
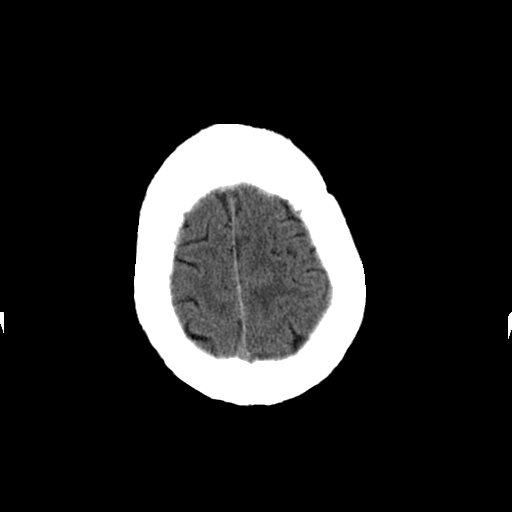
[im 27/32  brain]
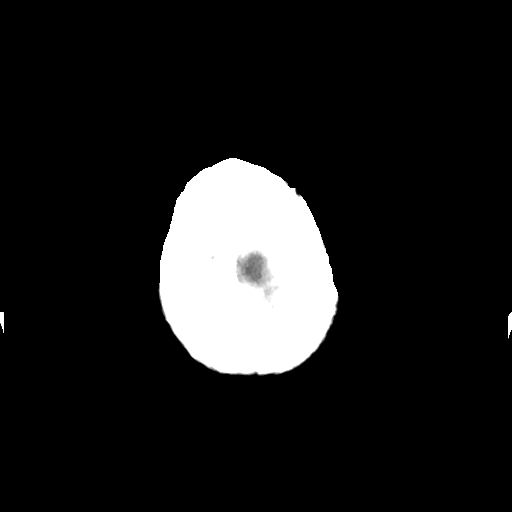
[im 29/32  brain]
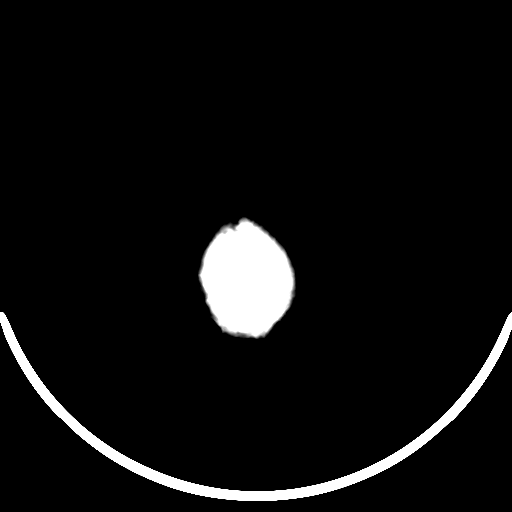
[im 29/32  bone]
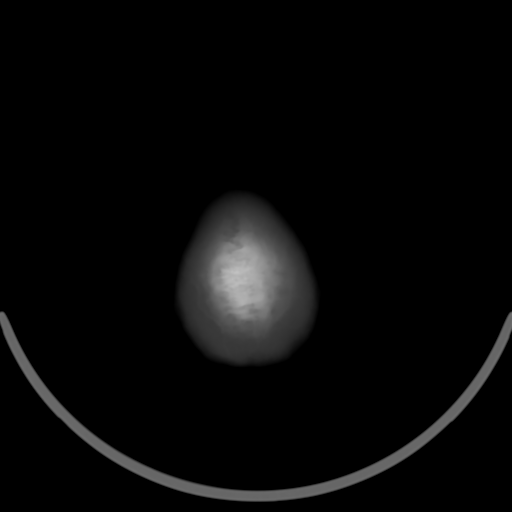

[Series 302: head w/o bone, idose (1) · axial · non-contrast · 0.49mm/px · z∈[+112,+132]mm · 2 of 32 slices shown]
[im 3/32  bone]
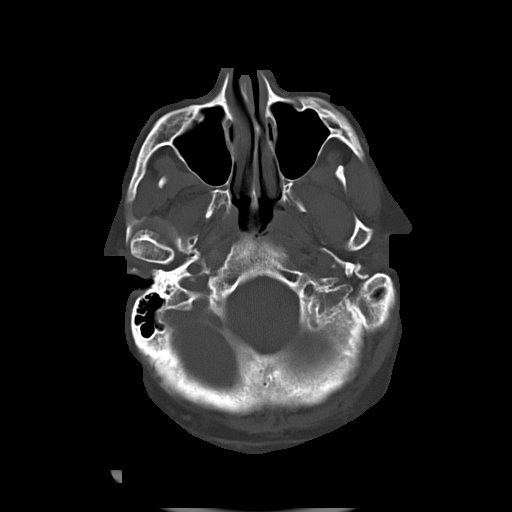
[im 7/32  bone]
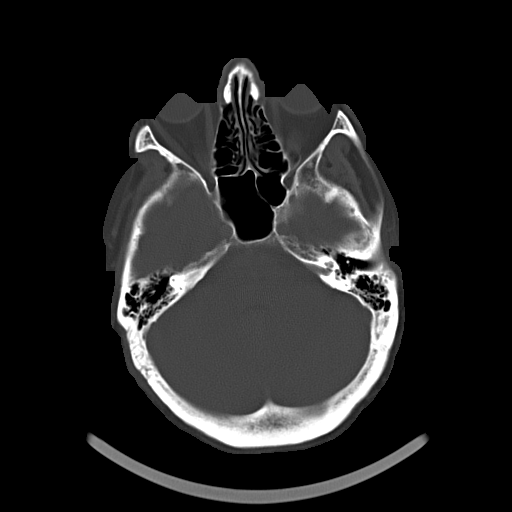

[15 of 30 positions shown; findings below may reference images not displayed]

FINDINGS: There is no evidence acute intracranial hemorrhage, mass lesion,
brain edema or extra-axial fluid collection. There is extensive low
density throughout the periventricular and subcortical white matter
bilaterally which appears similar to previous MRI. There is
involvement of the basal ganglia bilaterally. There is mild
generalized atrophy.

The visualized paranasal sinuses, mastoid air cells and middle ears
are clear. The calvarium is intact.
IMPRESSION: No evidence of acute intracranial hemorrhage or obvious change in
extensive chronic small vessel ischemic changes.

These results were called by telephone at the time of interpretation
on 01/20/2015 at [DATE] to Dr. Ou, who verbally acknowledged
these results.

## 2017-11-14 MED ORDER — OXYCODONE HCL 15 MG PO TABS
7.50 | ORAL_TABLET | ORAL | Status: DC
Start: ? — End: 2017-11-14

## 2017-11-14 MED ORDER — NALOXONE HCL 0.4 MG/ML IJ SOLN
0.40 | INTRAMUSCULAR | Status: DC
Start: ? — End: 2017-11-14

## 2017-11-14 MED ORDER — ACETAMINOPHEN 500 MG PO TABS
1000.00 | ORAL_TABLET | ORAL | Status: DC
Start: 2017-11-14 — End: 2017-11-14

## 2017-11-14 MED ORDER — ATORVASTATIN CALCIUM 10 MG PO TABS
20.00 | ORAL_TABLET | ORAL | Status: DC
Start: 2017-11-14 — End: 2017-11-14

## 2017-11-14 MED ORDER — GABAPENTIN 300 MG PO CAPS
300.00 | ORAL_CAPSULE | ORAL | Status: DC
Start: 2017-11-14 — End: 2017-11-14

## 2017-11-14 MED ORDER — BISACODYL 10 MG RE SUPP
10.00 | RECTAL | Status: DC
Start: ? — End: 2017-11-14

## 2017-11-14 MED ORDER — NALOXONE HCL 0.4 MG/ML IJ SOLN
0.10 | INTRAMUSCULAR | Status: DC
Start: ? — End: 2017-11-14

## 2017-11-14 MED ORDER — ALUMINUM-MAGNESIUM-SIMETHICONE 200-200-20 MG/5ML PO SUSP
30.00 | ORAL | Status: DC
Start: ? — End: 2017-11-14

## 2017-11-14 MED ORDER — ONDANSETRON HCL 4 MG/2ML IJ SOLN
4.00 | INTRAMUSCULAR | Status: DC
Start: ? — End: 2017-11-14

## 2017-11-14 MED ORDER — CELECOXIB 200 MG PO CAPS
200.00 | ORAL_CAPSULE | ORAL | Status: DC
Start: 2017-11-14 — End: 2017-11-14

## 2017-11-14 MED ORDER — TRAZODONE HCL 50 MG PO TABS
50.00 | ORAL_TABLET | ORAL | Status: DC
Start: 2017-11-14 — End: 2017-11-14

## 2017-11-14 MED ORDER — ASPIRIN EC 81 MG PO TBEC
81.00 | DELAYED_RELEASE_TABLET | ORAL | Status: DC
Start: 2017-11-15 — End: 2017-11-14

## 2017-11-14 MED ORDER — CLOPIDOGREL BISULFATE 75 MG PO TABS
75.00 | ORAL_TABLET | ORAL | Status: DC
Start: 2017-11-15 — End: 2017-11-14

## 2017-11-14 MED ORDER — SODIUM CHLORIDE FLUSH 0.9 % IV SOLN
10.00 | INTRAVENOUS | Status: DC
Start: 2017-11-14 — End: 2017-11-14

## 2017-11-14 MED ORDER — ENEMA 7-19 GM/118ML RE ENEM
1.00 | ENEMA | RECTAL | Status: DC
Start: ? — End: 2017-11-14

## 2017-11-14 MED ORDER — SODIUM CHLORIDE FLUSH 0.9 % IV SOLN
10.00 | INTRAVENOUS | Status: DC
Start: ? — End: 2017-11-14

## 2017-11-14 MED ORDER — SENNOSIDES-DOCUSATE SODIUM 8.6-50 MG PO TABS
2.00 | ORAL_TABLET | ORAL | Status: DC
Start: 2017-11-14 — End: 2017-11-14

## 2017-11-14 MED ORDER — OXYCODONE HCL 5 MG PO TABS
5.00 | ORAL_TABLET | ORAL | Status: DC
Start: ? — End: 2017-11-14

## 2017-11-14 MED ORDER — LACTATED RINGERS IV SOLN
75.00 | INTRAVENOUS | Status: DC
Start: ? — End: 2017-11-14

## 2018-11-27 ENCOUNTER — Encounter: Payer: Self-pay | Admitting: Internal Medicine
